# Patient Record
Sex: Male | Born: 1960
Health system: Southern US, Community
[De-identification: ages and names within clinical notes are randomized; demographics above are authoritative.]

## PROBLEM LIST (undated history)

## (undated) DIAGNOSIS — K5730 Diverticulosis of large intestine without perforation or abscess without bleeding: Secondary | ICD-10-CM

## (undated) DIAGNOSIS — I050 Rheumatic mitral stenosis: Secondary | ICD-10-CM

## (undated) DIAGNOSIS — E559 Vitamin D deficiency, unspecified: Secondary | ICD-10-CM

## (undated) DIAGNOSIS — R079 Chest pain, unspecified: Secondary | ICD-10-CM

## (undated) DIAGNOSIS — I341 Nonrheumatic mitral (valve) prolapse: Secondary | ICD-10-CM

## (undated) DIAGNOSIS — I38 Endocarditis, valve unspecified: Secondary | ICD-10-CM

## (undated) DIAGNOSIS — I2510 Atherosclerotic heart disease of native coronary artery without angina pectoris: Secondary | ICD-10-CM

## (undated) DIAGNOSIS — G43109 Migraine with aura, not intractable, without status migrainosus: Secondary | ICD-10-CM

## (undated) DIAGNOSIS — N400 Enlarged prostate without lower urinary tract symptoms: Secondary | ICD-10-CM

## (undated) DIAGNOSIS — E538 Deficiency of other specified B group vitamins: Secondary | ICD-10-CM

## (undated) DIAGNOSIS — R7303 Prediabetes: Secondary | ICD-10-CM

## (undated) DIAGNOSIS — G479 Sleep disorder, unspecified: Secondary | ICD-10-CM

## (undated) DIAGNOSIS — T8859XA Other complications of anesthesia, initial encounter: Secondary | ICD-10-CM

## (undated) DIAGNOSIS — I509 Heart failure, unspecified: Secondary | ICD-10-CM

## (undated) DIAGNOSIS — J189 Pneumonia, unspecified organism: Secondary | ICD-10-CM

## (undated) DIAGNOSIS — A6920 Lyme disease, unspecified: Secondary | ICD-10-CM

## (undated) HISTORY — PX: US ECHOCARDIOGRAPHY: HXRAD669

## (undated) HISTORY — PX: OTHER SURGICAL HISTORY: SHX169

## (undated) HISTORY — DX: Nonrheumatic mitral (valve) prolapse: I34.1

## (undated) HISTORY — DX: Migraine with aura, not intractable, without status migrainosus: G43.109

## (undated) HISTORY — DX: Lyme disease, unspecified: A69.20

---

## 1998-08-01 HISTORY — PX: INGUINAL HERNIA REPAIR: SUR1180

## 2003-08-02 DIAGNOSIS — A6920 Lyme disease, unspecified: Secondary | ICD-10-CM

## 2003-08-02 HISTORY — DX: Lyme disease, unspecified: A69.20

## 2003-08-05 ENCOUNTER — Ambulatory Visit (HOSPITAL_COMMUNITY): Admission: RE | Admit: 2003-08-05 | Discharge: 2003-08-05 | Payer: Self-pay | Admitting: Neurology

## 2003-08-11 ENCOUNTER — Inpatient Hospital Stay (HOSPITAL_COMMUNITY): Admission: EM | Admit: 2003-08-11 | Discharge: 2003-08-14 | Payer: Self-pay | Admitting: Emergency Medicine

## 2003-08-19 ENCOUNTER — Other Ambulatory Visit: Payer: Self-pay

## 2003-08-21 ENCOUNTER — Encounter: Payer: Self-pay | Admitting: Internal Medicine

## 2003-10-20 ENCOUNTER — Encounter: Payer: Self-pay | Admitting: Internal Medicine

## 2004-06-11 ENCOUNTER — Ambulatory Visit: Payer: Self-pay | Admitting: Internal Medicine

## 2005-03-28 ENCOUNTER — Ambulatory Visit: Payer: Self-pay | Admitting: Internal Medicine

## 2005-04-06 ENCOUNTER — Ambulatory Visit: Payer: Self-pay | Admitting: Internal Medicine

## 2005-07-14 ENCOUNTER — Ambulatory Visit: Payer: Self-pay | Admitting: Family Medicine

## 2005-07-22 ENCOUNTER — Ambulatory Visit: Payer: Self-pay | Admitting: General Surgery

## 2005-09-12 ENCOUNTER — Ambulatory Visit: Payer: Self-pay | Admitting: Internal Medicine

## 2006-05-26 ENCOUNTER — Ambulatory Visit: Payer: Self-pay | Admitting: Internal Medicine

## 2006-06-01 ENCOUNTER — Encounter: Payer: Self-pay | Admitting: Cardiology

## 2006-06-01 ENCOUNTER — Encounter: Payer: Self-pay | Admitting: Internal Medicine

## 2006-06-01 ENCOUNTER — Ambulatory Visit: Payer: Self-pay

## 2006-06-08 ENCOUNTER — Ambulatory Visit: Payer: Self-pay | Admitting: Internal Medicine

## 2006-06-09 ENCOUNTER — Encounter: Payer: Self-pay | Admitting: Internal Medicine

## 2006-06-09 ENCOUNTER — Ambulatory Visit: Payer: Self-pay | Admitting: Cardiology

## 2006-06-15 ENCOUNTER — Other Ambulatory Visit: Payer: Self-pay

## 2006-06-15 ENCOUNTER — Observation Stay: Payer: Self-pay | Admitting: Internal Medicine

## 2006-06-29 ENCOUNTER — Ambulatory Visit: Payer: Self-pay | Admitting: Internal Medicine

## 2006-07-17 ENCOUNTER — Ambulatory Visit: Payer: Self-pay | Admitting: Family Medicine

## 2006-07-31 ENCOUNTER — Ambulatory Visit: Payer: Self-pay | Admitting: Family Medicine

## 2006-08-10 ENCOUNTER — Ambulatory Visit: Payer: Self-pay | Admitting: Internal Medicine

## 2006-08-17 ENCOUNTER — Emergency Department: Payer: Self-pay | Admitting: General Practice

## 2006-08-28 ENCOUNTER — Ambulatory Visit: Payer: Self-pay | Admitting: Family Medicine

## 2006-08-28 LAB — CONVERTED CEMR LAB
ALT: 15 units/L (ref 0–40)
AST: 21 units/L (ref 0–37)
Albumin: 4.2 g/dL (ref 3.5–5.2)
Alkaline Phosphatase: 51 units/L (ref 39–117)
BUN: 11 mg/dL (ref 6–23)
Basophils Absolute: 0 10*3/uL (ref 0.0–0.1)
Basophils Relative: 0.1 % (ref 0.0–1.0)
CO2: 30 meq/L (ref 19–32)
Calcium: 9.5 mg/dL (ref 8.4–10.5)
Chloride: 102 meq/L (ref 96–112)
Creatinine, Ser: 0.9 mg/dL (ref 0.4–1.5)
Eosinophils Absolute: 0 10*3/uL (ref 0.0–0.6)
Eosinophils Relative: 0.2 % (ref 0.0–5.0)
GFR calc Af Amer: 118 mL/min
GFR calc non Af Amer: 97 mL/min
Glucose, Bld: 99 mg/dL (ref 70–99)
HCT: 44.9 % (ref 39.0–52.0)
Hemoglobin: 15.4 g/dL (ref 13.0–17.0)
Lymphocytes Relative: 17.2 % (ref 12.0–46.0)
MCHC: 34.3 g/dL (ref 30.0–36.0)
MCV: 94.5 fL (ref 78.0–100.0)
Monocytes Absolute: 0.5 10*3/uL (ref 0.2–0.7)
Monocytes Relative: 6.1 % (ref 3.0–11.0)
Neutro Abs: 6.3 10*3/uL (ref 1.4–7.7)
Neutrophils Relative %: 76.4 % (ref 43.0–77.0)
Platelets: 218 10*3/uL (ref 150–400)
Potassium: 4.3 meq/L (ref 3.5–5.1)
RBC: 4.75 M/uL (ref 4.22–5.81)
RDW: 11.6 % (ref 11.5–14.6)
Sed Rate: 8 mm/hr (ref 0–20)
Sodium: 140 meq/L (ref 135–145)
TSH: 0.92 microintl units/mL (ref 0.35–5.50)
Total Bilirubin: 0.7 mg/dL (ref 0.3–1.2)
Total Protein: 7.4 g/dL (ref 6.0–8.3)
WBC: 8.2 10*3/uL (ref 4.5–10.5)

## 2006-09-01 ENCOUNTER — Ambulatory Visit: Payer: Self-pay | Admitting: Internal Medicine

## 2006-11-21 HISTORY — PX: MITRAL VALVE REPAIR: SHX2039

## 2006-11-21 HISTORY — PX: THORACOTOMY: SUR1349

## 2006-12-19 ENCOUNTER — Encounter: Payer: Self-pay | Admitting: Internal Medicine

## 2006-12-27 DIAGNOSIS — I059 Rheumatic mitral valve disease, unspecified: Secondary | ICD-10-CM | POA: Insufficient documentation

## 2006-12-27 DIAGNOSIS — I050 Rheumatic mitral stenosis: Secondary | ICD-10-CM | POA: Insufficient documentation

## 2006-12-28 ENCOUNTER — Ambulatory Visit: Payer: Self-pay | Admitting: Internal Medicine

## 2007-02-19 ENCOUNTER — Encounter: Payer: Self-pay | Admitting: Internal Medicine

## 2007-03-14 ENCOUNTER — Encounter: Payer: Self-pay | Admitting: Internal Medicine

## 2007-04-03 ENCOUNTER — Ambulatory Visit: Payer: Self-pay | Admitting: Internal Medicine

## 2007-04-03 DIAGNOSIS — M2550 Pain in unspecified joint: Secondary | ICD-10-CM | POA: Insufficient documentation

## 2007-04-04 LAB — CONVERTED CEMR LAB
ALT: 19 units/L (ref 0–53)
AST: 20 units/L (ref 0–37)
Albumin: 3.9 g/dL (ref 3.5–5.2)
Alkaline Phosphatase: 68 units/L (ref 39–117)
BUN: 15 mg/dL (ref 6–23)
Basophils Absolute: 0 10*3/uL (ref 0.0–0.1)
Basophils Relative: 0.2 % (ref 0.0–1.0)
Bilirubin, Direct: 0.1 mg/dL (ref 0.0–0.3)
CO2: 32 meq/L (ref 19–32)
Calcium: 9.2 mg/dL (ref 8.4–10.5)
Chloride: 107 meq/L (ref 96–112)
Creatinine, Ser: 1 mg/dL (ref 0.4–1.5)
Eosinophils Absolute: 0.1 10*3/uL (ref 0.0–0.6)
Eosinophils Relative: 1.6 % (ref 0.0–5.0)
GFR calc Af Amer: 104 mL/min
GFR calc non Af Amer: 86 mL/min
Glucose, Bld: 100 mg/dL — ABNORMAL HIGH (ref 70–99)
HCT: 45.9 % (ref 39.0–52.0)
Hemoglobin: 15.8 g/dL (ref 13.0–17.0)
Lymphocytes Relative: 38.3 % (ref 12.0–46.0)
MCHC: 34.3 g/dL (ref 30.0–36.0)
MCV: 92.6 fL (ref 78.0–100.0)
Monocytes Absolute: 0.5 10*3/uL (ref 0.2–0.7)
Monocytes Relative: 10.2 % (ref 3.0–11.0)
Neutro Abs: 2.4 10*3/uL (ref 1.4–7.7)
Neutrophils Relative %: 49.7 % (ref 43.0–77.0)
Phosphorus: 3.5 mg/dL (ref 2.3–4.6)
Platelets: 175 10*3/uL (ref 150–400)
Potassium: 4.3 meq/L (ref 3.5–5.1)
RBC: 4.96 M/uL (ref 4.22–5.81)
RDW: 12.9 % (ref 11.5–14.6)
Rheumatoid fact SerPl-aCnc: <20 intl units/mL — ABNORMAL LOW (ref 0.0–20.0)
Sed Rate: 5 mm/hr (ref 0–20)
Sodium: 143 meq/L (ref 135–145)
TSH: 0.77 microintl units/mL (ref 0.35–5.50)
Total Bilirubin: 0.8 mg/dL (ref 0.3–1.2)
Total Protein: 7 g/dL (ref 6.0–8.3)
WBC: 4.8 10*3/uL (ref 4.5–10.5)

## 2007-04-05 LAB — CONVERTED CEMR LAB: Anti Nuclear Antibody(ANA): NEGATIVE

## 2007-04-19 ENCOUNTER — Ambulatory Visit: Payer: Self-pay | Admitting: Internal Medicine

## 2007-04-19 DIAGNOSIS — R42 Dizziness and giddiness: Secondary | ICD-10-CM

## 2007-05-01 ENCOUNTER — Encounter: Payer: Self-pay | Admitting: Internal Medicine

## 2007-06-11 ENCOUNTER — Encounter: Payer: Self-pay | Admitting: Internal Medicine

## 2007-06-13 ENCOUNTER — Ambulatory Visit: Payer: Self-pay | Admitting: Family Medicine

## 2007-06-13 DIAGNOSIS — J069 Acute upper respiratory infection, unspecified: Secondary | ICD-10-CM | POA: Insufficient documentation

## 2007-11-26 ENCOUNTER — Encounter: Payer: Self-pay | Admitting: Internal Medicine

## 2008-02-27 ENCOUNTER — Encounter: Payer: Self-pay | Admitting: Internal Medicine

## 2008-04-17 ENCOUNTER — Encounter: Payer: Self-pay | Admitting: Internal Medicine

## 2008-04-21 ENCOUNTER — Encounter: Payer: Self-pay | Admitting: Internal Medicine

## 2008-06-16 ENCOUNTER — Encounter: Payer: Self-pay | Admitting: Internal Medicine

## 2008-06-24 ENCOUNTER — Ambulatory Visit: Payer: Self-pay | Admitting: Internal Medicine

## 2008-06-24 DIAGNOSIS — I080 Rheumatic disorders of both mitral and aortic valves: Secondary | ICD-10-CM

## 2008-07-01 DIAGNOSIS — Q048 Other specified congenital malformations of brain: Secondary | ICD-10-CM

## 2008-07-01 HISTORY — DX: Other specified congenital malformations of brain: Q04.8

## 2008-07-01 HISTORY — PX: CYSTECTOMY: SUR359

## 2008-07-08 ENCOUNTER — Encounter: Payer: Self-pay | Admitting: Internal Medicine

## 2008-07-08 HISTORY — PX: CRANIOTOMY FOR CYST FENESTRATION: SHX1409

## 2008-07-21 ENCOUNTER — Ambulatory Visit: Payer: Self-pay | Admitting: Internal Medicine

## 2008-07-21 DIAGNOSIS — G930 Cerebral cysts: Secondary | ICD-10-CM

## 2009-08-26 ENCOUNTER — Telehealth: Payer: Self-pay | Admitting: Internal Medicine

## 2009-10-08 ENCOUNTER — Ambulatory Visit: Payer: Self-pay | Admitting: Internal Medicine

## 2009-10-08 DIAGNOSIS — M79609 Pain in unspecified limb: Secondary | ICD-10-CM

## 2009-10-08 DIAGNOSIS — L659 Nonscarring hair loss, unspecified: Secondary | ICD-10-CM | POA: Insufficient documentation

## 2010-04-27 ENCOUNTER — Telehealth: Payer: Self-pay | Admitting: Internal Medicine

## 2010-05-04 ENCOUNTER — Encounter: Payer: Self-pay | Admitting: Internal Medicine

## 2010-05-18 ENCOUNTER — Encounter: Payer: Self-pay | Admitting: Internal Medicine

## 2010-08-21 ENCOUNTER — Encounter: Payer: Self-pay | Admitting: Neurology

## 2010-08-29 LAB — CONVERTED CEMR LAB
ALT: 24 units/L (ref 0–53)
AST: 25 units/L (ref 0–37)
Albumin: 4.3 g/dL (ref 3.5–5.2)
Alkaline Phosphatase: 49 units/L (ref 39–117)
BUN: 18 mg/dL (ref 6–23)
Basophils Absolute: 0 10*3/uL (ref 0.0–0.1)
Basophils Relative: 0 % (ref 0.0–3.0)
Bilirubin Urine: NEGATIVE
Bilirubin, Direct: 0.1 mg/dL (ref 0.0–0.3)
CO2: 29 meq/L (ref 19–32)
Calcium: 9.4 mg/dL (ref 8.4–10.5)
Chloride: 105 meq/L (ref 96–112)
Creatinine, Ser: 1.1 mg/dL (ref 0.4–1.5)
Eosinophils Absolute: 0.1 10*3/uL (ref 0.0–0.7)
Eosinophils Relative: 1.3 % (ref 0.0–5.0)
GFR calc Af Amer: 93 mL/min
GFR calc non Af Amer: 77 mL/min
Glucose, Bld: 95 mg/dL (ref 70–99)
HCT: 45.3 % (ref 39.0–52.0)
Hemoglobin, Urine: NEGATIVE
Hemoglobin: 16 g/dL (ref 13.0–17.0)
INR: 1 (ref 0.8–1.0)
Ketones, ur: NEGATIVE mg/dL
Leukocytes, UA: NEGATIVE
Lymphocytes Relative: 34.9 % (ref 12.0–46.0)
MCHC: 35.3 g/dL (ref 30.0–36.0)
MCV: 95.4 fL (ref 78.0–100.0)
Monocytes Absolute: 0.6 10*3/uL (ref 0.1–1.0)
Monocytes Relative: 10.1 % (ref 3.0–12.0)
Neutro Abs: 2.9 10*3/uL (ref 1.4–7.7)
Neutrophils Relative %: 53.7 % (ref 43.0–77.0)
Nitrite: NEGATIVE
Phosphorus: 3.7 mg/dL (ref 2.3–4.6)
Platelets: 170 10*3/uL (ref 150–400)
Potassium: 3.8 meq/L (ref 3.5–5.1)
Protein, ur: NEGATIVE mg/dL
Prothrombin Time: 12.3 s (ref 10.9–13.3)
RBC / HPF: NONE SEEN (ref <3)
RBC: 4.75 M/uL (ref 4.22–5.81)
RDW: 12 % (ref 11.5–14.6)
Sodium: 139 meq/L (ref 135–145)
Specific Gravity, Urine: 1.025 (ref 1.005–1.03)
Total Bilirubin: 1.1 mg/dL (ref 0.3–1.2)
Total Protein: 7.3 g/dL (ref 6.0–8.3)
Urine Glucose: NEGATIVE mg/dL
Urobilinogen, UA: 0.2 (ref 0.0–1.0)
WBC: 5.6 10*3/uL (ref 4.5–10.5)
aPTT: 30.9 s — ABNORMAL HIGH (ref 21.7–29.8)
pH: 6.5 (ref 5.0–8.0)

## 2010-08-31 NOTE — Progress Notes (Signed)
Summary: wants referral to rheumatologist  Phone Note Call from Patient Call back at Home Phone [phone]   Caller: Patient Call For: Andrew Salt MD Summary of Call: Pt is asking for referral to rheumatologist for joint pain.  He says you are aware of his problems.  He does not want to go to [place]. Initial call taken by: Lowella Petties CMA,  April 27, 2010 11:24 AM  Follow-up for Phone Call        consult order put in Follow-up by: Andrew Salt MD,  April 27, 2010 2:09 PM

## 2010-08-31 NOTE — Assessment & Plan Note (Signed)
Summary: REFILL MED/DLO   Vital Signs:  Patient profile:   50 year old male Height:      70 inches Weight:      184 pounds BMI:     26.50 Temp:     98.7 degrees F oral Pulse rate:   72 / minute Pulse rhythm:   regular BP sitting:   112 / 80  (left arm) Cuff size:   large  Vitals Entered By: Mervin Hack CMA Duncan Dull) (October 08, 2009 4:07 PM) CC: med refill   History of Present Illness: doing much better Imbalance and tingling has subsided substantially Not really having the weakness  Still plays tennis  Needs the propecia no problems with this  still satisfied with the results  having trouble with foot pain Can be in either foot or wrist--at radial head Big toe, arch of foot, radial head are most common spots notes it most when first putting foot down first  wrist pain may be day or night better if he works it out some No swelling  Allergies: 1)  ! Levaquin  Past History:  Past medical, surgical, family and social histories (including risk factors) reviewed for relevance to current acute and chronic problems.  Past Medical History: Reviewed history from 12/27/2006 and no changes required. MVP/MR  Past Surgical History: Reviewed history from 07/21/2008 and no changes required. Post LP syndrome (tachycardia,  couldnt sleep)  08/2003 Lyme desease 20005 RIH  ([place])  2000 Echo  MVP/mild MR Normal EF  12/1990 Echo  moderate MR  10/2003 Holter  benign  10/2003 Mitral valve repair R  thoracotomy  10/2006 Arachnoid cyst removal 12/09  Family History: Reviewed history from 04/03/2007 and no changes required. Dad died @67   MI CAD in Dad and pat GF No HTN, DM, No prostate or colon cancer No lupus or RA  Social History: Reviewed history from 12/27/2006 and no changes required. 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   Head:  normocephalic, atraumatic, and no alopecia.   Msk:  normal ROM, no joint tenderness, and no joint swelling.   Neurologic:  strength normal in all extremities and gait normal.   Psych:  normally interactive, good eye contact, not anxious appearing, and not depressed appearing.     Impression & Recommendations:  Problem # 1:  FOOT PAIN, BILATERAL (ICD-729.5) Assessment New feet pain seems to be mechanical discussed trying stability shoes--he does have a preserved arch  Wrist pain at radial heads is puzzling if it continues, would set up rheum appt (though nothing to suggest true synovitis at this point)  Problem # 2:  MALE PATTERN BALDNESS (ICD-704.00) Assessment: Comment Only still doing well with the propecia  Complete Medication List: 1)  Propecia 1 Mg Tabs (Finasteride) .... Take 1 tablet by mouth once a day 2)  Aspirin 81 Mg Tbec (Aspirin) .... Take one by mouth daily 3)  Propecia Pro-pak 1 Mg Tabs (Finasteride) .Marland Kitchen.. 1 tab daily  Patient Instructions: 1)  Please schedule a follow-up appointment in 1 year.  Prescriptions: PROPECIA PRO-PAK 1 MG TABS (FINASTERIDE) 1 tab daily  #90 x 3   Entered and Authorized by:   Cindee Salt MD   Signed by:   Cindee Salt MD on 10/08/2009   Method  used:   [facility]ronically to        [id number]       [address]       [place], [place]  [address]       Ph: [phone]       Fax: [phone]   RxID:   [id number]   Current Allergies (reviewed today): ! LEVAQUIN

## 2010-08-31 NOTE — Progress Notes (Signed)
Summary: Needs appt.  Phone Note Outgoing Call Call back at [facility] Phone [phone]   Call placed by: DeShannon Katrinka Blazing CMA Duncan Dull),  August 26, 2009 3:10 PM Call placed to: Patient Summary of Call: calling pt to advise he needs a follow-up appt in order to get his refills, pt has not been seen since 07/2008. Initial call taken by: Mervin Hack CMA Duncan Dull),  August 26, 2009 3:10 PM  Follow-up for Phone Call        left message on machine that I would refill for 1 month and that pt needs appt. Follow-up by: Mervin Hack CMA Duncan Dull),  August 26, 2009 3:11 PM

## 2010-08-31 NOTE — Consult Note (Signed)
Summary: [facility]   Imported By: Lanelle Bal 05/18/2010 11:50:19  _____________________________________________________________________  External Attachment:    Type:   Image     Comment:   External Document  Appended Document: [facility] doing some testing but doesn't think there is an autoimmune disorder

## 2010-08-31 NOTE — Letter (Signed)
Summary: [facility]   Imported By: Sherian Rein 06/01/2010 08:55:35  _____________________________________________________________________  External Attachment:    Type:   Image     Comment:   External Document  Appended Document: [facility] still no answers for his symptoms

## 2010-11-22 ENCOUNTER — Other Ambulatory Visit: Payer: Self-pay | Admitting: Internal Medicine

## 2010-12-17 NOTE — H&P (Signed)
NAME:  Andrew Porter, Andrew Porter                   ACCOUNT NO.:  [id number]   MEDICAL RECORD NO.:  [id number]                   PATIENT TYPE:  INP   LOCATION:  1826                                 FACILITY:  [facility]   PHYSICIAN:  Genene Churn. Love, M.D.                 DATE OF BIRTH:  Nov 11, 1960   DATE OF ADMISSION:  08/11/2003  DATE OF DISCHARGE:                                HISTORY & PHYSICAL   PATIENT'S ADDRESS:  [address]  [place], [place] [address]   HISTORY OF PRESENT ILLNESS:  This is the first Andrew Porter admission  for this 50 year old right-handed white married male from [place], [place], admitted from the emergency room for evaluation of continued  headache and diffuse tingling.   HISTORY OF PRESENT ILLNESS:  In December of 2003, Andrew Porter had some  episodes of severe sudden headache associated with a visual disturbance  lasting for 5 to 10 minutes, but has also had progressive headache.  His  headaches did resolve but recurred sometime this spring and was evaluated  with MRI studies of the brain, Dec 27, 2002 and May 30, 2003 and  June 17, 2003, at [facility]; this included a pituitary  evaluation.  On July 13, 2004, he had an MRI angiogram which was felt to  be normal.  The MRIs showed evidence of an enlarged cisterna magna.  He has  had progression of his headaches and had also had episodes of tachycardia in  the past but a known history of mitral valve prolapse.  He had a plasma  metanephrine that was normal as an outpatient and an estimated sed rate that  was normal as an outpatient.  On August 05, 2003, he had a spinal tap with  opening pressure of 190 mmH20.  He CSF was normal except for an elevated  protein of 83.  He had only 2 white blood cells, no red blood cells, a  glucose of 63 with a protein of 83.  CSF IgG was unremarkable.  His CSF VDRL  was nonreactive and CSF cryptococcal antigen was negative.   He has continued  to have problems with headache; he has also had diffuse tingling.  He has  had episodes of tachycardia.  While at the short-stay unit for his LP, he  had episodes of tachycardia.  He had received prophylactic Augmentin for his  mitral valve prolapse and it was thought that the tachycardia may be  secondary to his mitral valve prolapse and he was given a prescription for  Toprol-XL 25 mg at home.  At home, he has continued to feel poorly and was  seen at [facility] ER, August 07, 2003, and at [facility] ER, August 09, 2003.  He had blood work at [facility].  He had  an EKG and what sounds to have been a chest x-ray and a CT scan of the  brain,  which I reviewed, which were reported to the patient as being normal.  He has had a T4 and TSH which were normal and a 24-hour urine for  metanephrine has been collected but not sent.  This morning, he continued to  feel badly.  He has not slept in several days, according to his mother and  according to his wife.  He comes to the emergency room for further  evaluation.   PAST MEDICAL HISTORY:  His past medical history is significant for:  1. Mitral valve prolapse.  2. A right hernia repair.  3. Headaches.   SOCIAL HISTORY:  He is married and states he is happy with his life, lives  with his wife, has a daughter 6 and sons 104 and 41 months of age in good  health.  He quit caffeine in 2002 and does not use tobacco.  He drinks 2  drinks per week of alcohol and he has a master's degree.  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   FAMILY HISTORY:  His mother is 69, living and well.  His father died at 37  from myocardial infarction.  He has sisters, 57, 10 and 62, living and well.   PHYSICAL EXAMINATION:  GENERAL:  Examination revealed a well-developed white  male who was somewhat anxious.  VITAL SIGNS:  The blood pressures in the right and left arm were 120/70.  Heart  rate was 88.  He was afebrile.  NECK:  There were no bruits.  His neck was supple.  NEUROLOGIC:  Mental status:  He was alert and oriented x3, followed one, two-  and three-step commands.  His cranial nerve examination revealed visual  fields full, disks flat, extraocular movements full, corneals present, no  7th nerve palsy, hearing intact, air conduction greater than bone  conduction, tongue midline, uvula midline, gags present.  Sternocleidomastoid and trapezius testing normal.  Motor examination:  Strength 5/5 proximally and distally in the upper and lower extremities.  Sensory examination intact to pinprick, light touch, joint position and  vibration testing.  Deep tendon reflexes were 1 to 2+ and plantar responses  downgoing.  HEENT:  General examination with tympanic membranes clear, mouth in good  repair.  LUNGS:  Lungs clear.  CARDIAC:  He had a loud late systolic murmur.  There was no enlargement of  liver, spleen or kidneys.  ABDOMEN:  Bowel sounds were normal.  EXTREMITIES:  There was no cyanosis, clubbing or edema in the extremities.   IMPRESSION:  1. Recurrent headaches, code 784.0.  2. Abnormal cerebrospinal fluid with elevated protein, code 792.0.  3. Mitral valve prolapse, code 421.0.  4. Anxiety, code 300.0.   PLAN:  Plan at this time is to admit the patient for MRI and MR venogram in  view of slightly elevated opening pressure of 190 at the time of LP and the  elevated CSF protein to see if there is any possibility of sagittal sinus  thrombosis as a cause for his headache and symptomatology.                                                Genene Churn. Sandria Manly, M.D.    JML/MEDQ  D:  08/11/2003  T:  08/11/2003  Job:  [id number]   cc:   Lorie Phenix  [address]  [place]  [place] [address]  Fax: [phone]

## 2010-12-17 NOTE — Consult Note (Signed)
NAME:  Andrew Porter, Andrew Porter                   ACCOUNT NO.:  [id number]   MEDICAL RECORD NO.:  [id number]                   PATIENT TYPE:  INP   LOCATION:  3032                                 FACILITY:  [facility]   PHYSICIAN:  Salvadore Farber, M.D.             DATE OF BIRTH:  1960-08-17   DATE OF CONSULTATION:  08/13/2003  DATE OF DISCHARGE:                                   CONSULTATION   REFERRING PHYSICIAN:  Genene Churn. Love, M.D.   REASON FOR CONSULTATION:  I was asked by Dr. Genene Churn. Love to see this  patient with palpitations and headaches.   HISTORY OF PRESENT ILLNESS:  Andrew Porter is a 50 year old gentleman with  mitral valve prolapse and chronic headaches.  He has been evaluated over the  past 14 months for severe and chronic headaches with an extremely extensive  evaluation at multiple hospitals.   One week ago, while undergoing lumbar puncture by Dr. Genene Churn. Love the  patient experienced palpitations.  He was discharged to home.  The details  of that are currently available.  He subsequently awoke with palpitations  and went to [facility] Room.  There he was told he had no arrhythmia and  was discharged home.  He is now hospitalized for further evaluation of his  headaches.  He tells me that he is currently experiencing palpitations  similar to those which occurred during his lumbar puncture, similar to those  which prompted his presentation to [facility].   Monitor currently demonstrates normal sinus rhythm and has been sinus rhythm  throughout his hospitalization here.  Associated with these palpitations, he  tells me he has tingling all over his body particularly of his scalp.  Denies nausea, vomiting, diaphoresis, chest pain, exertional dyspnea, PND,  orthopnea, and edema.  No history of syncope.   PAST MEDICAL HISTORY:  1. Chronic headache.  2. Mitral valve prolapse.  3. Right inguinal hernia repair.   ALLERGIES:  No known drug allergies.   MEDICATIONS:   Xanax, Lexapro, Toprol XL 25 mg q.d. started this  hospitalization.   SOCIAL HISTORY:  The patient lives in [place] with his wife.  He is a  Economist of a Psychiatric nurse.  He is married with three young  children.  Denies tobacco use.  Drinks occasionally.   FAMILY HISTORY:  Father died at 4 of myocardial infarction.  His mother and  three sisters are alive and well.   REVIEW OF SYMPTOMS:  There is a small rash on the left side of his face,  altered vision with headaches.  There are no distinct aura.  Complains of  nasal discharge, occasional nose bleeds, vertigo, presyncope, though not  accompanying his palpitations.  Occasional chest pain, also not accompanying  his palpitations.   PHYSICAL EXAMINATION:  GENERAL:  He is a generally well-appearing male in no  distress.  VITAL SIGNS:  Heart is 65, blood pressure 138/72, respiratory rate 18.  Oxygen saturation of 95% on room air.  NECK:  He has no jugular venous distention.  LUNGS: Clear to auscultation.  HEART:  He has a nondisplaced point of maximum cardiac impulse.  There is a  regular rate and rhythm with a 2/6 holosystolic murmur best heard at the  apex and radiating to the axilla.  ABDOMEN:  Soft, nontender, and nondistended.  There is no  hepatosplenomegaly.  Bowel sounds are normal.  EXTREMITIES:  Warm without edema.  SKIN:  Questionable malar rash on his rash.  NEUROLOGICAL:  Grossly intact.   LABORATORY DATA:  Electrocardiogram shows normal sinus rhythm, normal EKG.  Monitor rhythm shows sinus rhythm with rates ranging from 44 beats per  minute to 90 beats per minute.  No pauses.  No SVT and no VT.   LABORATORY DATA:  Remarkable for a hematocrit of 38, creatinine 1.0, TSH  2.3.   IMPRESSION:  1. Palpitations:  The patient complains of palpitations during documented     normal sinus rhythm.  There is firm evidence that he is not having an     arrhythmia.  While he is in hospital for other reasons,  would continue     monitor to further definitively exclude arrhythmia.  I think arrhythmia     is unlikely.  2. Mitral regurgitation:  At least moderate mitral regurgitation by exam.     We will obtain echocardiogram to assess.  The patient will need     endocarditis prophylaxis.  3. Headaches:  Per Dr. Genene Churn. Love.                                               Salvadore Farber, M.D.    WED/MEDQ  D:  08/13/2003  T:  08/14/2003  Job:  [id number]   cc:   Genene Churn. Love, M.D.  [address]  [address]  [place]  [place] [address]  Fax: [phone]   Lorie Phenix  [address]  [place]  [place] [address]  Fax: [phone]

## 2010-12-17 NOTE — Op Note (Signed)
NAME:  Andrew Porter, Andrew Porter                   ACCOUNT NO.:  [id number]   MEDICAL RECORD NO.:  [id number]                   PATIENT TYPE:  OUT   LOCATION:  MDC                                  FACILITY:  [facility]   PHYSICIAN:  Genene Churn. Love, M.D.                 DATE OF BIRTH:  [birth year]   DATE OF PROCEDURE:  08/05/2003  DATE OF DISCHARGE:                                 OPERATIVE REPORT   ADDENDUM:  During the day, the patient had palpitations of heart rate to  140s.  An IV was recommended but he did not wish this.  Xanax was  recommended but he did not wish this.  A beta blocker was recommended, but  again, he did not wish that.  He began to feel better with time alone.  His  laboratory results revealed white blood cell count 4,800, hemoglobin 14.8,  hematocrit 43.6, platelet count 184,000.  Sodium 139, potassium 3.9,  chloride 105, CO2 29, BUN 12, creatinine 1, glucose 131.  Liver function  tests normal.  CSF showed 2 white blood cells, no red blood cells, glucose  was 63, protein 83, cryptococcal antigens negative.  The patient has a known  history of mitral valve prolapse in the past.   IMPRESSION:  Tachycardia, possibly related to a history of mitral valve  prolapse.   PLAN:  1 gram Amoxicillin as SBE preventative and 500 mg b.i.d. tomorrow.  Also, to start Toprol XL 1/2 50 mg tablet per day should palpitations  continue and also to be used for migraine prophylaxis.                                               Genene Churn. Sandria Manly, M.D.    JML/MEDQ  D:  08/05/2003  T:  08/05/2003  Job:  [id number]

## 2010-12-17 NOTE — Op Note (Signed)
NAME:  Andrew Porter, Andrew Porter                   ACCOUNT NO.:  [id number]   MEDICAL RECORD NO.:  [id number]                   PATIENT TYPE:  OUT   LOCATION:  MDC                                  FACILITY:  [facility]   PHYSICIAN:  Genene Churn. Love, M.D.                 DATE OF BIRTH:  11-26-1960   DATE OF PROCEDURE:  08/05/2003  DATE OF DISCHARGE:                                 OPERATIVE REPORT   PROCEDURE:   SURGEON:  Genene Churn. Love, M.D.   INDICATIONS:  The patient has a history of headaches and is being evaluated  for opening pressure and CSF evaluation.   DESCRIPTION OF PROCEDURE:  The patient was prepped and draped in the left  lateral decubitus position using Betadine and 1% Xylocaine.  The L3-4  interspace was entered without difficulty.  On entry, the patient complained  of some left leg pain which resolved within seconds.  He did develop  hyperventilation and tachycardia during that time.  Opening pressure then  was measured.  It was 190. This was thought to be related to the anxiety.  Clear colored CSF was obtained and sent for studies.  The patient tolerated  the procedure well.  He did complain during the study of some left hand  numbness as well.  He was lying on his left arm.                                               Genene Churn. Sandria Manly, M.D.    JML/MEDQ  D:  08/05/2003  T:  08/05/2003  Job:  [id number]

## 2010-12-17 NOTE — Discharge Summary (Signed)
NAME:  Andrew Porter, Andrew Porter                   ACCOUNT NO.:  [id number]   MEDICAL RECORD NO.:  [id number]                   PATIENT TYPE:  INP   LOCATION:  3032                                 FACILITY:  [facility]   PHYSICIAN:  Genene Churn. Love, M.D.                 DATE OF BIRTH:  October 12, 1960   DATE OF ADMISSION:  08/11/2003  DATE OF DISCHARGE:  08/14/2003                                 DISCHARGE SUMMARY   REASON FOR ADMISSION:  This was the first [facility] admission for  this 50 year old right-handed white married male from [place], [place] admitted from the emergency room for evaluation of continued  headache and diffuse tingling.   HISTORY OF PRESENT ILLNESS:  In December 2003 Mr. Mallinger had episodes of  sudden headache at times associated with visual disturbance lasting 5-10  minutes and also progressive headache which was evaluated with MRI studies  of the brain Dec 27, 2002, May 30, 2003, and June 17, 2003.  These  were not all MRI studies of the brain, some were of the pituitary gland.  On  July 14, 2003 after seeing me he had an MR angiogram at [facility] which was felt to be normal.  His MRIs have shown evidence  of an enlarged cisterna magna.  He has a known history of mitral valve  prolapse.  As an outpatient estimated sed rate and plasma metanephrine have  been normal for evaluation of headaches.  On August 05, 2003 he had a spinal  tap with adding pressure of 190 mmH2O CSF was normal except for elevated  protein of 83, there were 2 white blood cells, no red blood cells, glucose  of 63, normal CSF IgG, normal CSF oligoclonal IgG and negative CSF  cryptococcal antigen and negative bacterial antigens and bacterial culture.  Fungal culture is still pending as is a Lyme test.  While in short-stay unit  following his LP he developed episodes of tachycardia, he had received  prophylactic Augmentin for mitral valve prolapse though it  was a sterile  procedure.  At home he continued to have episodes of tachycardia and noted  that when he would lie down he would develop a burning sensation on the top  of his head which is similar to what he had had prior to the LP.  These  episodes seem to be worse with lying down.  He was seen at the emergency  room at [facility] and also at [facility].  As an outpatient he had a T4, TSH, and 24-hour urine for  metanephrines and 5-HIAA which came back during this hospitalization which  were normal.  He was seen in the emergency room on the morning of August 11, 2003 for continued symptoms and admitted for evaluation.   PAST MEDICAL HISTORY:  His past medical history is significant for mitral  valve prolapse, right hernia repair, and headaches.  SOCIAL HISTORY:  He is married, states he is happy with life, lives with his  daughter 28 and son 7 years and one 82 months of age.  He quit caffeine  February 2002.  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   PHYSICAL EXAMINATION:  His general examination was unremarkable.   LABORATORY DATA:  Twelve-lead EKG was unremarkable.  MRI study of the brain  and MRV to rule out sagittal sinus thrombosis was unremarkable.  Telemetry  in the hospital revealed heart rates as high as 90 and as low as in the 50s.  His blood studies included a CBC and a basic metabolic panel which showed no  abnormalities.  Repeat 24-hour urine for metanephrines was being performed  since the first could not be found initially.  Liver function tests were not  performed during this hospitalization.  Twelve lead EKG was normal.   HOSPITAL COURSE:  Patient was seen in consult by Dr. Samule Ohm.  It is felt  that his cardiac evaluation could be continued as an outpatient, he did not  receive a 2-D echo inhospital, he was found to have evidence of mitral  insufficiency with a loud systolic murmur.  In the  hospital he was treated  with Xanax and Lexapro for what was thought to be anxiety symptoms as well  as his head discomfort.  It was not felt he had a post-LP headache since he  felt better standing up.  He gradually improved in the hospital.  Prior to  his hospitalization he had difficulty sleeping but in the hospital using IV  Ativan he did sleep well.  He had improved in the hospital and was deemed  ready to be discharged.   IMPRESSION:  1. Recurrent headaches.  (Code 784.0)  2. Elevated CSF protein.  (Code 792.0)  3. Mitral valve insufficiency.  (Code 424.0)  4. Anxiety.  (Code 300.00)   PLAN:  Treat the patient with Xanax 0.25 mg t.i.d., Lexapro 5 mg once daily,  Toprol XL 25 mg once daily, stay out of work for 1 week and return to see me  in 2 weeks and have a 2-D echocardiogram as an outpatient, he is discharged  improved from his prehospital status.                                                Genene Churn. Sandria Manly, M.D.    JML/MEDQ  D:  08/14/2003  T:  08/14/2003  Job:  [id number]   cc:   Dr. Elease Hashimoto, primary care physician  [facility]   Salvadore Farber, M.D.  [address]  [address]  [place]  [place] [address]

## 2011-02-28 ENCOUNTER — Telehealth: Payer: Self-pay | Admitting: *Deleted

## 2011-02-28 MED ORDER — SCOPOLAMINE 1 MG/3DAYS TD PT72: 1.0000 | MEDICATED_PATCH | TRANSDERMAL | Status: DC

## 2011-02-28 NOTE — Telephone Encounter (Signed)
Patient will be leaving on Thursday for a cruise and is asking for motion sickness patches. Uses [facility] [address]

## 2011-02-28 NOTE — Telephone Encounter (Signed)
Sent!

## 2011-08-10 ENCOUNTER — Other Ambulatory Visit: Payer: Self-pay | Admitting: *Deleted

## 2011-08-10 MED ORDER — FINASTERIDE 1 MG PO TABS: 1.0000 mg | ORAL_TABLET | Freq: Every day | ORAL | Status: DC

## 2011-08-10 NOTE — Telephone Encounter (Signed)
rx sent to pharmacy by e-script Left message asking patient to schedule appointment.

## 2011-08-10 NOTE — Telephone Encounter (Signed)
Okay to refill for a year but have him set up PE within the next few months

## 2011-08-10 NOTE — Telephone Encounter (Signed)
Does pt need to be seen? Last seen 09/2009 was told to f/u in 1 year, last CPX 2008.

## 2011-08-11 ENCOUNTER — Other Ambulatory Visit: Payer: Self-pay | Admitting: Internal Medicine

## 2012-01-10 ENCOUNTER — Encounter: Payer: Self-pay | Admitting: Internal Medicine

## 2012-01-10 ENCOUNTER — Ambulatory Visit (INDEPENDENT_AMBULATORY_CARE_PROVIDER_SITE_OTHER): Payer: BC Managed Care – PPO | Admitting: Internal Medicine

## 2012-01-10 VITALS — BP 118/80 | HR 77 | Temp 97.7°F | Wt 179.0 lb

## 2012-01-10 DIAGNOSIS — N453 Epididymo-orchitis: Secondary | ICD-10-CM | POA: Insufficient documentation

## 2012-01-10 DIAGNOSIS — R059 Cough, unspecified: Secondary | ICD-10-CM | POA: Insufficient documentation

## 2012-01-10 DIAGNOSIS — R05 Cough: Secondary | ICD-10-CM

## 2012-01-10 MED ORDER — DOXYCYCLINE HYCLATE 100 MG PO TABS: 100.0000 mg | ORAL_TABLET | Freq: Two times a day (BID) | ORAL | Status: AC

## 2012-01-10 NOTE — Assessment & Plan Note (Signed)
Seems to be post infectious cough Discussed cough meds---he will defer this

## 2012-01-10 NOTE — Progress Notes (Signed)
  Subjective:    Patient ID: Andrew Porter, male    DOB: [birth year], 51 y.o.   MRN: [id number]  HPI About 4 weeks ago got fever, chills and sore throat Got zpak at walk in clinic in [place] Helped sore throat but still with persistent cough occ gets some yellow mucus Cough worse at night No fever or chills now No SOB  Noted right testicle swelling and severe pain since then Right groin is painful now Started some left over augmentin last week---helped the swelling and pain (though some better) Has been on 2gm bid for full Rx (left over from Lyme disease times)  Current Outpatient Prescriptions on File Prior to Visit  Medication Sig Dispense Refill  . finasteride (PROPECIA) 1 MG tablet Take 1 tablet (1 mg total) by mouth daily.  90 tablet  2    Allergies  Allergen Reactions  . Levofloxacin     REACTION: psychotic nightmares, muscle aches    Past Medical History  Diagnosis Date  . MVP (mitral valve prolapse)   . Lyme disease 2005    Past Surgical History  Procedure Date  . Inguinal hernia repair 2000    right ( byrnett)  . US echocardiography 12/1990 & 10/2003    MVP/ mild MR normal EF, moderate MR 10/2003  . Cystectomy 12/09    arachnoid    Family History  Problem Relation Age of Onset  . Coronary artery disease Father   . Coronary artery disease Paternal Grandfather   . Diabetes Neg Hx   . Hypertension Neg Hx   . Cancer Neg Hx     History   Social History  . Marital Status: Married    Spouse Name: N/A    Number of Children: 3  . Years of Education: N/A   Occupational History  . president of [employer]    Social History Main Topics  . Smoking status: Never Smoker   . Smokeless tobacco: Never Used  . Alcohol Use: Yes  . Drug Use: No  . Sexually Active: Not on file   Other Topics Concern  . Not on file   Social History Narrative  . No narrative on file   Review of Systems Voids okay No dysuria No sex outside of  marriage Finally coming out of the prolonged unusual neurologic/rheumatologic symptoms    Objective:   Physical Exam  Constitutional: He appears well-developed and well-nourished. No distress.  HENT:  Mouth/Throat: Oropharynx is clear and moist. No oropharyngeal exudate.  Neck: Normal range of motion. Neck supple. No thyromegaly present.  Pulmonary/Chest: Effort normal and breath sounds normal. No respiratory distress. He has no wheezes. He has no rales.  Abdominal: Soft. He exhibits no mass. There is no tenderness.  Genitourinary:       No inguinal hernia Mild swelling in right testes and epididymis with tenderness  Lymphadenopathy:    He has no cervical adenopathy.          Assessment & Plan:

## 2012-01-10 NOTE — Assessment & Plan Note (Signed)
Mild ongoing inflammation  Seems to have partially responded to the augmentin Will finish the augmentin If ongoing symptoms--rx for doxy to use prn

## 2012-03-02 ENCOUNTER — Ambulatory Visit: Payer: BC Managed Care – PPO | Admitting: Internal Medicine

## 2012-05-22 ENCOUNTER — Encounter: Payer: Self-pay | Admitting: Internal Medicine

## 2012-05-22 ENCOUNTER — Ambulatory Visit (INDEPENDENT_AMBULATORY_CARE_PROVIDER_SITE_OTHER): Payer: BC Managed Care – PPO | Admitting: Internal Medicine

## 2012-05-22 VITALS — BP 112/78 | HR 69 | Temp 97.9°F | Ht 70.0 in | Wt 183.0 lb

## 2012-05-22 DIAGNOSIS — G479 Sleep disorder, unspecified: Secondary | ICD-10-CM

## 2012-05-22 DIAGNOSIS — Z0000 Encounter for general adult medical examination without abnormal findings: Secondary | ICD-10-CM | POA: Insufficient documentation

## 2012-05-22 DIAGNOSIS — G43109 Migraine with aura, not intractable, without status migrainosus: Secondary | ICD-10-CM

## 2012-05-22 DIAGNOSIS — Z23 Encounter for immunization: Secondary | ICD-10-CM

## 2012-05-22 DIAGNOSIS — Z1211 Encounter for screening for malignant neoplasm of colon: Secondary | ICD-10-CM

## 2012-05-22 LAB — CBC WITH DIFFERENTIAL/PLATELET
Basophils Absolute: 0 10*3/uL (ref 0.0–0.1)
Basophils Relative: 0.6 % (ref 0.0–3.0)
Eosinophils Absolute: 0 10*3/uL (ref 0.0–0.7)
Eosinophils Relative: 0.8 % (ref 0.0–5.0)
HCT: 44.8 % (ref 39.0–52.0)
Hemoglobin: 14.9 g/dL (ref 13.0–17.0)
Lymphocytes Relative: 38.7 % (ref 12.0–46.0)
Lymphs Abs: 1.7 10*3/uL (ref 0.7–4.0)
MCHC: 33.3 g/dL (ref 30.0–36.0)
MCV: 97.1 fl (ref 78.0–100.0)
Monocytes Absolute: 0.5 10*3/uL (ref 0.1–1.0)
Monocytes Relative: 10.1 % (ref 3.0–12.0)
Neutro Abs: 2.2 10*3/uL (ref 1.4–7.7)
Neutrophils Relative %: 49.8 % (ref 43.0–77.0)
Platelets: 161 10*3/uL (ref 150.0–400.0)
RBC: 4.61 Mil/uL (ref 4.22–5.81)
RDW: 12.7 % (ref 11.5–14.6)
WBC: 4.5 10*3/uL (ref 4.5–10.5)

## 2012-05-22 LAB — HEPATIC FUNCTION PANEL
ALT: 18 U/L (ref 0–53)
AST: 24 U/L (ref 0–37)
Albumin: 4 g/dL (ref 3.5–5.2)
Alkaline Phosphatase: 59 U/L (ref 39–117)
Bilirubin, Direct: 0.1 mg/dL (ref 0.0–0.3)
Total Bilirubin: 0.8 mg/dL (ref 0.3–1.2)

## 2012-05-22 LAB — BASIC METABOLIC PANEL
BUN: 22 mg/dL (ref 6–23)
CO2: 30 mEq/L (ref 19–32)
Calcium: 9.2 mg/dL (ref 8.4–10.5)
Chloride: 104 mEq/L (ref 96–112)
Creatinine, Ser: 1 mg/dL (ref 0.4–1.5)
GFR: 81.13 mL/min (ref >60.00)
Glucose, Bld: 79 mg/dL (ref 70–99)
Potassium: 4.3 mEq/L (ref 3.5–5.1)
Sodium: 138 mEq/L (ref 135–145)

## 2012-05-22 LAB — LIPID PANEL
Cholesterol: 200 mg/dL (ref 0–200)
LDL Cholesterol: 131 mg/dL — ABNORMAL HIGH (ref 0–99)
Total CHOL/HDL Ratio: 4
Triglycerides: 96 mg/dL (ref 0.0–149.0)
VLDL: 19.2 mg/dL (ref 0.0–40.0)

## 2012-05-22 MED ORDER — TRAZODONE HCL 50 MG PO TABS: 50.0000 mg | ORAL_TABLET | Freq: Every day | ORAL | Status: DC

## 2012-05-22 MED ORDER — SUMATRIPTAN SUCCINATE 100 MG PO TABS: 50.0000 mg | ORAL_TABLET | Freq: Every day | ORAL | Status: DC | PRN

## 2012-05-22 NOTE — Assessment & Plan Note (Signed)
Will try trazodone

## 2012-05-22 NOTE — Assessment & Plan Note (Signed)
Has clear cut history now May have some relationship to long standing sensory disturbance Will try sumatriptan at aura

## 2012-05-22 NOTE — Assessment & Plan Note (Signed)
Undiagnosed long term neurologic condition Otherwise healthy Will do stool immunoassay PSA after discussion  Tdap

## 2012-05-22 NOTE — Progress Notes (Signed)
Subjective:    Patient ID: Andrew Porter, male    DOB: [birth year], 51 y.o.   MRN: [id number]  HPI Here for physical Still with neurologic symptoms Still has tingling episodes Intermittent vision loss--everything is blurry in both eyes Does feel better since the arachnoid cyst but still there  Still plays tennis, does about 2/3rds full work schedule Only able to get about 5 hours of sleep Sleeps a little more soundly with diphenhydramine  Discussed cancer screening  Current Outpatient Prescriptions on File Prior to Visit  Medication Sig Dispense Refill  . finasteride (PROPECIA) 1 MG tablet Take 1 tablet (1 mg total) by mouth daily.  90 tablet  2  . traZODone (DESYREL) 50 MG tablet Take 1-2 tablets (50-100 mg total) by mouth at bedtime.  60 tablet  11    Allergies  Allergen Reactions  . Levofloxacin     REACTION: psychotic nightmares, muscle aches    Past Medical History  Diagnosis Date  . MVP (mitral valve prolapse)   . Lyme disease 2005    Past Surgical History  Procedure Date  . Inguinal hernia repair 2000    right ( byrnett)  . US echocardiography 12/1990 & 10/2003    MVP/ mild MR normal EF, moderate MR 10/2003  . Cystectomy 12/09    arachnoid    Family History  Problem Relation Age of Onset  . Coronary artery disease Father   . Coronary artery disease Paternal Grandfather   . Diabetes Neg Hx   . Hypertension Neg Hx   . Cancer Neg Hx     History   Social History  . Marital Status: Married    Spouse Name: N/A    Number of Children: 3  . Years of Education: N/A   Occupational History  . president of [employer]    Social History Main Topics  . Smoking status: Never Smoker   . Smokeless tobacco: Never Used  . Alcohol Use: Yes  . Drug Use: No  . Sexually Active: Not on file   Other Topics Concern  . Not on file   Social History Narrative  . No narrative on file   Review of Systems  Constitutional: Negative for fatigue  and unexpected weight change.       Wears seat belt  HENT: Negative for hearing loss, congestion, rhinorrhea, dental problem and tinnitus.        Regular with dentist  Eyes: Positive for visual disturbance.       Gets blurry vision and episodic focal vision loss  Respiratory: Negative for cough, chest tightness and shortness of breath.   Cardiovascular: Positive for leg swelling. Negative for chest pain and palpitations.       Gets indentation in lower calves at times  Gastrointestinal: Negative for nausea, vomiting, abdominal pain, constipation and blood in stool.       No heartburn  Genitourinary: Positive for urgency. Negative for frequency.       Urgency at night  No sexual problems  Musculoskeletal: Negative for back pain, joint swelling and arthralgias.  Skin: Positive for rash.       Rash on chest and scalp---has seen derm  Neurological: Positive for dizziness, numbness and headaches. Negative for syncope, weakness and light-headedness.       Does get headaches after focal vision loss Uses ibuprofen or exedrin migraine Limited caffeine  Hematological: Negative for adenopathy. Does not bruise/bleed easily.  Psychiatric/Behavioral: Positive for disturbed wake/sleep cycle. Negative for dysphoric mood. The patient  is not nervous/anxious.        Objective:   Physical Exam  Constitutional: He is oriented to person, place, and time. He appears well-developed and well-nourished. No distress.  HENT:  Head: Normocephalic and atraumatic.  Right Ear: External ear normal.  Left Ear: External ear normal.  Mouth/Throat: Oropharynx is clear and moist. No oropharyngeal exudate.  Eyes: Conjunctivae normal and EOM are normal. Pupils are equal, round, and reactive to light.  Neck: Normal range of motion. Neck supple. No thyromegaly present.  Cardiovascular: Normal rate, regular rhythm, normal heart sounds and intact distal pulses.  Exam reveals no gallop.   No murmur heard. Pulmonary/Chest:  Effort normal and breath sounds normal. No respiratory distress. He has no wheezes. He has no rales.  Abdominal: Soft. There is no tenderness.  Musculoskeletal: He exhibits no edema and no tenderness.  Lymphadenopathy:    He has no cervical adenopathy.  Neurological: He is alert and oriented to person, place, and time.  Skin: No rash noted. No erythema.  Psychiatric: He has a normal mood and affect. His behavior is normal. Thought content normal.          Assessment & Plan:

## 2012-05-22 NOTE — Addendum Note (Signed)
Addended by: Sueanne Margarita on: 05/22/2012 10:34 AM   Modules accepted: Orders

## 2012-05-24 LAB — TSH: TSH: 1.43 u[IU]/mL (ref 0.35–5.50)

## 2012-05-24 LAB — VITAMIN B12: Vitamin B-12: 509 pg/mL (ref 211–911)

## 2012-05-24 LAB — PSA: PSA: 0.42 ng/mL (ref 0.10–4.00)

## 2012-05-28 ENCOUNTER — Encounter: Payer: Self-pay | Admitting: *Deleted

## 2012-08-16 ENCOUNTER — Telehealth: Payer: Self-pay

## 2012-08-16 NOTE — Telephone Encounter (Signed)
Ginger, pts wife left v/m requesting 05/23/12 CPX office notes and lab results be faxed to Ginger at [phone]. I do not see where pt has signed designated party release form.Please advise.

## 2012-08-16 NOTE — Telephone Encounter (Signed)
He has told me Okay to fax once you have double checked the number

## 2012-08-16 NOTE — Telephone Encounter (Signed)
Is this ok? That fax number is not local

## 2012-08-20 NOTE — Telephone Encounter (Signed)
Left message that I need to verify fax number to fax office notes to.

## 2012-08-26 ENCOUNTER — Other Ambulatory Visit: Payer: Self-pay | Admitting: Internal Medicine

## 2013-05-10 ENCOUNTER — Emergency Department: Payer: Self-pay | Admitting: Internal Medicine

## 2013-05-10 LAB — BASIC METABOLIC PANEL
BUN: 19 mg/dL — ABNORMAL HIGH (ref 7–18)
Calcium, Total: 8.5 mg/dL (ref 8.5–10.1)
Co2: 27 mmol/L (ref 21–32)
Creatinine: 1.34 mg/dL — ABNORMAL HIGH (ref 0.60–1.30)
EGFR (Non-African Amer.): >60
Potassium: 3.7 mmol/L (ref 3.5–5.1)
Sodium: 139 mmol/L (ref 136–145)

## 2013-05-10 LAB — CBC
HCT: 42.1 % (ref 40.0–52.0)
MCH: 32.8 pg (ref 26.0–34.0)
MCHC: 34.3 g/dL (ref 32.0–36.0)
WBC: 5.7 10*3/uL (ref 3.8–10.6)

## 2013-05-10 LAB — CK: CK, Total: 153 U/L (ref 35–232)

## 2013-05-10 LAB — LIPID PANEL
Cholesterol: 175 mg/dL (ref 0–200)
HDL Cholesterol: 46 mg/dL (ref 40–60)
Ldl Cholesterol, Calc: 95 mg/dL (ref 0–100)
VLDL Cholesterol, Calc: 34 mg/dL (ref 5–40)

## 2013-05-10 LAB — LIPASE, BLOOD: Lipase: 202 U/L (ref 73–393)

## 2013-05-10 LAB — TROPONIN I: Troponin-I: <0.02 ng/mL

## 2013-05-15 ENCOUNTER — Ambulatory Visit: Payer: BC Managed Care – PPO | Admitting: Internal Medicine

## 2013-05-23 ENCOUNTER — Encounter: Payer: BC Managed Care – PPO | Admitting: Internal Medicine

## 2013-05-25 ENCOUNTER — Other Ambulatory Visit: Payer: Self-pay | Admitting: Internal Medicine

## 2013-06-03 ENCOUNTER — Encounter: Payer: Self-pay | Admitting: Internal Medicine

## 2013-06-03 ENCOUNTER — Ambulatory Visit (INDEPENDENT_AMBULATORY_CARE_PROVIDER_SITE_OTHER): Payer: BC Managed Care – PPO | Admitting: Internal Medicine

## 2013-06-03 VITALS — BP 110/70 | HR 60 | Temp 97.9°F | Wt 182.0 lb

## 2013-06-03 DIAGNOSIS — Z23 Encounter for immunization: Secondary | ICD-10-CM

## 2013-06-03 DIAGNOSIS — Z0000 Encounter for general adult medical examination without abnormal findings: Secondary | ICD-10-CM

## 2013-06-03 DIAGNOSIS — Z1211 Encounter for screening for malignant neoplasm of colon: Secondary | ICD-10-CM

## 2013-06-03 MED ORDER — SELENIUM SULFIDE 2.5 % EX LOTN: TOPICAL_LOTION | CUTANEOUS | Status: DC

## 2013-06-03 NOTE — Addendum Note (Signed)
Addended by: Sueanne Margarita on: 06/03/2013 03:56 PM   Modules accepted: Orders

## 2013-06-03 NOTE — Patient Instructions (Signed)
You can try miralax (polyethylene glycol) if your bowels get bound up.

## 2013-06-03 NOTE — Progress Notes (Signed)
Subjective:    Patient ID: Andrew Porter, male    DOB: 1960-11-23, 52 y.o.   MRN: [id number]  HPI Here for physical  Long term strange neuro symptoms like tingling, fatigue are all improved Has tried some supplements Using 2 multivitamins, magnesium and vitamin D 5000 units daily  Plays tennis hard for up to 2 ours Still on part time work schedule ---- 5 hours in office a day Sleeping is much better now-- 5 hours straight and then a little more after voiding  No headaches lately No visual disturbances for over 2 months The unstable feeling also not recently  Current Outpatient Prescriptions on File Prior to Visit  Medication Sig Dispense Refill  . aspirin 81 MG tablet Take 81 mg by mouth daily.      . Cholecalciferol 4000 UNITS CAPS Take by mouth daily.      . finasteride (PROPECIA) 1 MG tablet TAKE 1 TABLET DAILY  90 tablet  2  . ibuprofen (ADVIL,MOTRIN) 200 MG tablet Take 200 mg by mouth as needed.       No current facility-administered medications on file prior to visit.    Allergies  Allergen Reactions  . Levofloxacin     REACTION: psychotic nightmares, muscle aches    Past Medical History  Diagnosis Date  . MVP (mitral valve prolapse)   . Lyme disease 2005  . Migraine with aura     Past Surgical History  Procedure Laterality Date  . Inguinal hernia repair  2000    right ( byrnett)  . US echocardiography  12/1990 & 10/2003    MVP/ mild MR normal EF, moderate MR 10/2003  . Cystectomy  12/09    arachnoid    Family History  Problem Relation Age of Onset  . Coronary artery disease Father   . Coronary artery disease Paternal Grandfather   . Diabetes Neg Hx   . Hypertension Neg Hx   . Cancer Neg Hx     History   Social History  . Marital Status: Married    Spouse Name: N/A    Number of Children: 3  . Years of Education: N/A   Occupational History  . president of [employer]    Social History Main Topics  . Smoking status:  Never Smoker   . Smokeless tobacco: Never Used  . Alcohol Use: Yes  . Drug Use: No  . Sexual Activity: Not on file   Other Topics Concern  . Not on file   Social History Narrative  . No narrative on file   Review of Systems  Constitutional: Negative for fatigue and unexpected weight change.       Wears seat belt  HENT: Positive for congestion and tinnitus. Negative for dental problem, hearing loss and rhinorrhea.        Rare tinnitus Regular with dentist  Eyes: Positive for visual disturbance.       No recent vision issue  Respiratory: Negative for cough, chest tightness and shortness of breath.   Cardiovascular: Positive for chest pain and palpitations. Negative for leg swelling.       Went to ER about 6 weeks ago at [facility]--- not heart and sent home No problems playing tennis Palpitations are less frequent  Gastrointestinal: Positive for constipation. Negative for nausea, vomiting, abdominal pain and blood in stool.       Thinks chest pain was fatigue and had eaten too much No regular indigestion Doesn't take meds for constipation (incomplete evacuation)  Endocrine: Negative  for cold intolerance and heat intolerance.  Genitourinary: Negative for urgency, frequency and difficulty urinating.       No sexual problems  Musculoskeletal: Negative for arthralgias, back pain and joint swelling.  Skin: Positive for rash.       Intermittent rash over sternum-- gets discharge at times  Allergic/Immunologic: Negative for environmental allergies and immunocompromised state.  Neurological: Positive for dizziness, numbness and headaches. Negative for syncope, weakness and light-headedness.       No symptoms in months  Hematological: Negative for adenopathy. Does not bruise/bleed easily.  Psychiatric/Behavioral: Positive for sleep disturbance. Negative for dysphoric mood. The patient is not nervous/anxious.        Objective:   Physical Exam  Constitutional: He is oriented to person,  place, and time. He appears well-developed and well-nourished. No distress.  HENT:  Head: Normocephalic and atraumatic.  Right Ear: External ear normal.  Left Ear: External ear normal.  Mouth/Throat: Oropharynx is clear and moist. No oropharyngeal exudate.  Eyes: Conjunctivae and EOM are normal. Pupils are equal, round, and reactive to light.  Neck: Normal range of motion. Neck supple. No thyromegaly present.  Cardiovascular: Normal rate, regular rhythm, normal heart sounds and intact distal pulses.  Exam reveals no gallop.   No murmur heard. Pulmonary/Chest: Effort normal and breath sounds normal. No respiratory distress. He has no wheezes. He has no rales.  Abdominal: Soft. There is no tenderness.  Musculoskeletal: He exhibits no edema and no tenderness.  Lymphadenopathy:    He has no cervical adenopathy.  Neurological: He is alert and oriented to person, place, and time.  Skin: Rash noted.  Flaky red rash over sternum (?TV)  Psychiatric: He has a normal mood and affect. His behavior is normal.          Assessment & Plan:

## 2013-06-03 NOTE — Assessment & Plan Note (Signed)
Healthy His vague syndrome of dizziness, tingling, etc is finally improving Discussed PSA---wait till next year He will do the fecal immunoassay  Defer labs Try selenium on rash

## 2013-06-17 ENCOUNTER — Other Ambulatory Visit (INDEPENDENT_AMBULATORY_CARE_PROVIDER_SITE_OTHER): Payer: BC Managed Care – PPO

## 2013-06-17 DIAGNOSIS — Z1211 Encounter for screening for malignant neoplasm of colon: Secondary | ICD-10-CM

## 2013-06-17 LAB — FECAL OCCULT BLOOD, IMMUNOCHEMICAL: Fecal Occult Bld: NEGATIVE

## 2014-02-21 ENCOUNTER — Ambulatory Visit: Payer: Self-pay

## 2014-02-21 ENCOUNTER — Encounter: Payer: Self-pay | Admitting: Podiatry

## 2014-02-21 ENCOUNTER — Ambulatory Visit (INDEPENDENT_AMBULATORY_CARE_PROVIDER_SITE_OTHER): Payer: Managed Care, Other (non HMO) | Admitting: Podiatry

## 2014-02-21 VITALS — BP 112/71 | HR 67 | Resp 16 | Ht 68.0 in | Wt 180.0 lb

## 2014-02-21 DIAGNOSIS — M7660 Achilles tendinitis, unspecified leg: Secondary | ICD-10-CM

## 2014-02-21 DIAGNOSIS — M779 Enthesopathy, unspecified: Secondary | ICD-10-CM

## 2014-02-21 DIAGNOSIS — M79609 Pain in unspecified limb: Secondary | ICD-10-CM

## 2014-02-21 MED ORDER — DICLOFENAC SODIUM 75 MG PO TBEC: 75.0000 mg | DELAYED_RELEASE_TABLET | Freq: Two times a day (BID) | ORAL | Status: DC

## 2014-02-21 MED ORDER — TRIAMCINOLONE ACETONIDE 10 MG/ML IJ SUSP
10.0000 mg | Freq: Once | INTRAMUSCULAR | Status: AC
Start: 2014-02-21 — End: 2014-02-21
  Administered 2014-02-21: 10 mg

## 2014-02-21 NOTE — Patient Instructions (Signed)

## 2014-02-21 NOTE — Progress Notes (Signed)
   Subjective:    Patient ID: Andrew Porter, male    DOB: 19-Nov-1960, 53 y.o.   MRN: [id number]  HPI Comments: Left back of heel pain, its been going on for about a year, after playing tennis on Sunday it was very difficult to walk on Monday   Foot Pain      Review of Systems  All other systems reviewed and are negative.      Objective:   Physical Exam        Assessment & Plan:

## 2014-02-22 NOTE — Progress Notes (Signed)
Subjective:     Patient ID: Andrew Porter, male   DOB: 08/15/1960, 53 y.o.   MRN: [id number]  Foot Pain   patient points to the right posterior heel stating it's been getting very sore especially after tennis and he has moderate pain on the right but not to the same degree as the left and that this is been going on for a year and worsening   Review of Systems  All other systems reviewed and are negative.      Objective:   Physical Exam  Nursing note and vitals reviewed. Constitutional: He is oriented to person, place, and time.  Cardiovascular: Intact distal pulses.   Musculoskeletal: Normal range of motion.  Neurological: He is oriented to person, place, and time.  Skin: Skin is warm.   neurovascular status is intact with muscle strength adequate and range of motion subtalar midtarsal joint within normal limits he is splinting slightly on his left but does not appear to have significant equinus and he is quite a bit of discomfort in the lateral aspect of the insertion of the Achilles tendon into the left heel moderate in the center noted in the medial and mild discomfort on the right. Digits are well-perfused and arch height mildly diminished     Assessment:     Achilles tendinitis left over right with inflammation and fluid buildup and acute flareup on the lateral side    Plan:     H&P and x-rays reviewed and today I did a careful lateral injection 3 mg Kenalog 5 mg Xylocaine Marcaine mixture and applied air fracture walker. Prior to the procedure I did discuss risk and also dispensed heel lifts and instructions on stretching exercises for the right and will reappoint again in 3 weeks. If everything is good he may resume tennis in approximately 2 weeks

## 2014-02-27 ENCOUNTER — Other Ambulatory Visit: Payer: Self-pay | Admitting: Internal Medicine

## 2014-03-14 ENCOUNTER — Ambulatory Visit: Payer: Managed Care, Other (non HMO) | Admitting: Podiatry

## 2014-05-16 ENCOUNTER — Other Ambulatory Visit: Payer: Self-pay

## 2014-06-06 ENCOUNTER — Ambulatory Visit (INDEPENDENT_AMBULATORY_CARE_PROVIDER_SITE_OTHER): Payer: Private Health Insurance - Indemnity | Admitting: Internal Medicine

## 2014-06-06 ENCOUNTER — Encounter: Payer: Self-pay | Admitting: Internal Medicine

## 2014-06-06 VITALS — BP 110/70 | HR 69 | Temp 98.0°F | Ht 68.0 in | Wt 182.0 lb

## 2014-06-06 DIAGNOSIS — Z1211 Encounter for screening for malignant neoplasm of colon: Secondary | ICD-10-CM

## 2014-06-06 DIAGNOSIS — Z0000 Encounter for general adult medical examination without abnormal findings: Secondary | ICD-10-CM

## 2014-06-06 DIAGNOSIS — Z125 Encounter for screening for malignant neoplasm of prostate: Secondary | ICD-10-CM

## 2014-06-06 DIAGNOSIS — Z23 Encounter for immunization: Secondary | ICD-10-CM

## 2014-06-06 DIAGNOSIS — R5383 Other fatigue: Secondary | ICD-10-CM | POA: Insufficient documentation

## 2014-06-06 LAB — COMPREHENSIVE METABOLIC PANEL
ALBUMIN: 3.8 g/dL (ref 3.5–5.2)
ALK PHOS: 70 U/L (ref 39–117)
ALT: 18 U/L (ref 0–53)
AST: 23 U/L (ref 0–37)
BUN: 20 mg/dL (ref 6–23)
CALCIUM: 9.4 mg/dL (ref 8.4–10.5)
CHLORIDE: 103 meq/L (ref 96–112)
CO2: 26 mEq/L (ref 19–32)
Creatinine, Ser: 1.2 mg/dL (ref 0.4–1.5)
GFR: 70.87 mL/min (ref >60.00)
Glucose, Bld: 82 mg/dL (ref 70–99)
POTASSIUM: 4.2 meq/L (ref 3.5–5.1)
Sodium: 140 mEq/L (ref 135–145)
Total Bilirubin: 1.1 mg/dL (ref 0.2–1.2)
Total Protein: 7.2 g/dL (ref 6.0–8.3)

## 2014-06-06 LAB — CBC WITH DIFFERENTIAL/PLATELET
BASOS ABS: 0 10*3/uL (ref 0.0–0.1)
Basophils Relative: 0.5 % (ref 0.0–3.0)
Eosinophils Absolute: 0.1 10*3/uL (ref 0.0–0.7)
Eosinophils Relative: 1.1 % (ref 0.0–5.0)
HCT: 46.6 % (ref 39.0–52.0)
Hemoglobin: 15.2 g/dL (ref 13.0–17.0)
LYMPHS PCT: 29.8 % (ref 12.0–46.0)
Lymphs Abs: 1.7 10*3/uL (ref 0.7–4.0)
MCHC: 32.5 g/dL (ref 30.0–36.0)
MCV: 97.7 fl (ref 78.0–100.0)
Monocytes Absolute: 0.6 10*3/uL (ref 0.1–1.0)
Monocytes Relative: 10.3 % (ref 3.0–12.0)
Neutro Abs: 3.4 10*3/uL (ref 1.4–7.7)
Neutrophils Relative %: 58.3 % (ref 43.0–77.0)
Platelets: 182 10*3/uL (ref 150.0–400.0)
RBC: 4.77 Mil/uL (ref 4.22–5.81)
RDW: 13.3 % (ref 11.5–15.5)
WBC: 5.8 10*3/uL (ref 4.0–10.5)

## 2014-06-06 LAB — LIPID PANEL
Cholesterol: 191 mg/dL (ref 0–200)
HDL: 51.2 mg/dL (ref >39.00)
LDL CALC: 132 mg/dL — AB (ref 0–99)
NONHDL: 139.8
TRIGLYCERIDES: 40 mg/dL (ref 0.0–149.0)
Total CHOL/HDL Ratio: 4
VLDL: 8 mg/dL (ref 0.0–40.0)

## 2014-06-06 LAB — T4, FREE: Free T4: 0.79 ng/dL (ref 0.60–1.60)

## 2014-06-06 LAB — PSA: PSA: 0.31 ng/mL (ref 0.10–4.00)

## 2014-06-06 NOTE — Assessment & Plan Note (Addendum)
Generally healthy Still with strange neurologic symptoms and rash--but overall better Will check PSA after discussion Fecal immunoassay He is considering zostavax early (will hold off since live vaccine)

## 2014-06-06 NOTE — Assessment & Plan Note (Signed)
Gets sudden feeling of fatigue after dinner and has to sleep Will just check labs

## 2014-06-06 NOTE — Progress Notes (Signed)
Pre visit review using our clinic review tool, if applicable. No additional management support is needed unless otherwise documented below in the visit note. 

## 2014-06-06 NOTE — Progress Notes (Signed)
Subjective:    Patient ID: Andrew Porter, male    DOB: 1961-06-10, 53 y.o.   MRN: [id number]  HPI Here for physical  Still with bilateral Achilles pain Mostly a problem with tennis Has been doing stretching Steady for past 3-4 months  Doing much better overall Old neurologic symptoms continue to ebb Using the magnesium, sleeping better Still with some tingling--but otherwise feels normal at least half the time  Will still "crash" after dinner Just gets urge to lie down and then is "out" for a couple of hours Awakens with severe urgency--goes to bathroom. Then up again for the rest of the evening More noticeable on work days--but will happen on day off also Not every day  Current Outpatient Prescriptions on File Prior to Visit  Medication Sig Dispense Refill  . aspirin 81 MG tablet Take 81 mg by mouth daily.    . Cholecalciferol 4000 UNITS CAPS Take by mouth daily.    . finasteride (PROPECIA) 1 MG tablet TAKE 1 TABLET BY MOUTH DAILY 90 tablet 0  . ibuprofen (ADVIL,MOTRIN) 200 MG tablet Take 200 mg by mouth as needed.    . selenium sulfide (SELSUN) 2.5 % shampoo Apply topically once a week. Apply to rash in evening and wash off in the morning 120 mL 5   No current facility-administered medications on file prior to visit.    Allergies  Allergen Reactions  . Levofloxacin     REACTION: psychotic nightmares, muscle aches    Past Medical History  Diagnosis Date  . MVP (mitral valve prolapse)   . Lyme disease 2005  . Migraine with aura     Past Surgical History  Procedure Laterality Date  . Inguinal hernia repair  2000    right ( byrnett)  . [place] echocardiography  12/1990 & 10/2003    MVP/ mild MR normal EF, moderate MR 10/2003  . Cystectomy  12/09    arachnoid    Family History  Problem Relation Age of Onset  . Coronary artery disease Father   . Coronary artery disease Paternal Grandfather   . Diabetes Neg Hx   . Hypertension Neg Hx   . Cancer Neg Hx      History   Social History  . Marital Status: Married    Spouse Name: N/A    Number of Children: 3  . Years of Education: N/A   Occupational History  . president of [employer]    Social History Main Topics  . Smoking status: Never Smoker   . Smokeless tobacco: Never Used  . Alcohol Use: Yes  . Drug Use: No  . Sexual Activity: Not on file   Other Topics Concern  . Not on file   Social History Narrative   Review of Systems  Constitutional: Negative for fatigue and unexpected weight change.       Wears seat belt  HENT: Negative for dental problem, hearing loss and tinnitus.        Regular with dentist  Eyes: Negative for visual disturbance.       No diplopia or unilateral vision loss Vision is down with his fatigue spells  Respiratory: Negative for cough, chest tightness and shortness of breath.   Cardiovascular: Negative for chest pain, palpitations and leg swelling.  Gastrointestinal: Negative for nausea, vomiting, abdominal pain, constipation and blood in stool.  Endocrine: Positive for heat intolerance and polydipsia.  Genitourinary: Positive for urgency and frequency. Negative for difficulty urinating.  No sexual problems  Musculoskeletal: Positive for back pain. Negative for joint swelling.       Moderate back pain Achilles issues  Skin: Positive for rash.       Still gets rash on chest --comes and goes. Selenium not clearly helpful Rash around nose when more of his neuro symptoms  Allergic/Immunologic: Negative for environmental allergies and immunocompromised state.  Neurological: Negative for syncope, weakness, light-headedness and headaches.       Still gets tingling  Hematological: Negative for adenopathy. Does not bruise/bleed easily.  Psychiatric/Behavioral: Negative for sleep disturbance and dysphoric mood. The patient is not nervous/anxious.        Objective:   Physical Exam  Constitutional: He is oriented to person, place, and  time. He appears well-developed and well-nourished. No distress.  HENT:  Head: Normocephalic and atraumatic.  Right Ear: External ear normal.  Left Ear: External ear normal.  Mouth/Throat: Oropharynx is clear and moist.  Eyes: Conjunctivae and EOM are normal. Pupils are equal, round, and reactive to light.  Neck: Normal range of motion. Neck supple. No thyromegaly present.  Cardiovascular: Normal rate, regular rhythm, normal heart sounds and intact distal pulses.  Exam reveals no gallop.   No murmur heard. Pulmonary/Chest: Effort normal and breath sounds normal. No respiratory distress. He has no wheezes. He has no rales.  Abdominal: Soft. There is no tenderness.  Musculoskeletal: He exhibits no edema or tenderness.  Lymphadenopathy:    He has no cervical adenopathy.  Neurological: He is alert and oriented to person, place, and time.  Skin: No erythema.  Slight rash over sternum and sides of nose  Psychiatric: He has a normal mood and affect. His behavior is normal.          Assessment & Plan:

## 2014-06-06 NOTE — Addendum Note (Signed)
Addended by: Kaelan Amble L on: 08/16/2013 01:01 PM   Modules accepted: Orders  

## 2014-06-23 ENCOUNTER — Other Ambulatory Visit (INDEPENDENT_AMBULATORY_CARE_PROVIDER_SITE_OTHER): Payer: Private Health Insurance - Indemnity

## 2014-06-23 DIAGNOSIS — Z1211 Encounter for screening for malignant neoplasm of colon: Secondary | ICD-10-CM

## 2014-06-23 LAB — FECAL OCCULT BLOOD, IMMUNOCHEMICAL: FECAL OCCULT BLD: NEGATIVE

## 2014-09-10 ENCOUNTER — Other Ambulatory Visit: Payer: Self-pay | Admitting: Internal Medicine

## 2014-09-11 NOTE — Telephone Encounter (Signed)
Last filled 10/14 #90 with 2 refills--last OV was CPE 06/2014--please advise

## 2014-09-11 NOTE — Telephone Encounter (Signed)
Approved: okay to refill for a year 

## 2015-01-05 ENCOUNTER — Other Ambulatory Visit: Payer: Self-pay

## 2015-01-05 DIAGNOSIS — N453 Epididymo-orchitis: Secondary | ICD-10-CM

## 2015-01-12 ENCOUNTER — Telehealth: Payer: Self-pay | Admitting: Radiology

## 2015-06-10 ENCOUNTER — Ambulatory Visit (INDEPENDENT_AMBULATORY_CARE_PROVIDER_SITE_OTHER): Payer: Managed Care, Other (non HMO) | Admitting: Internal Medicine

## 2015-06-10 ENCOUNTER — Encounter: Payer: Self-pay | Admitting: Internal Medicine

## 2015-06-10 VITALS — BP 120/80 | HR 82 | Temp 98.2°F | Ht 68.0 in | Wt 186.0 lb

## 2015-06-10 DIAGNOSIS — Z23 Encounter for immunization: Secondary | ICD-10-CM | POA: Diagnosis not present

## 2015-06-10 DIAGNOSIS — Z0000 Encounter for general adult medical examination without abnormal findings: Secondary | ICD-10-CM

## 2015-06-10 NOTE — Addendum Note (Signed)
Addended by: Sueanne MargaritaSMITH, DESHANNON L on: 06/10/2015 02:58 PM   Modules accepted: Orders

## 2015-06-10 NOTE — Progress Notes (Signed)
Subjective:    Patient ID: Andrew Porter, male    DOB: 04-16-1961, 54 y.o.   MRN: [id number]  HPI Here for physical  Has the same "stuff as before" Getting better Still with tingling all over, excessive muscle fatigue--but doesn't get down so much Feels it might have been mitochondrial toxicity from levofloxacin Still with some Achilles pain--limits tennis to once a week (soreness lasts less time) Has elliptical --but hasn't used much He has neuropathy also--not as bad. At least he is able to sleep despite the tingling now  Still on propecia No ED  Still with recurrent right testicle pain Has seen urologist--- ?epididymitis?  Much less vision episodes--- every 3 months or so Rare pulsating vision times Treating with magnesium, glutathione, B12  Current Outpatient Prescriptions on File Prior to Visit  Medication Sig Dispense Refill  . aspirin 81 MG tablet Take 81 mg by mouth daily.    . Cholecalciferol 4000 UNITS CAPS Take by mouth daily.    . finasteride (PROPECIA) 1 MG tablet TAKE 1 TABLET BY MOUTH DAILY 90 tablet 3   No current facility-administered medications on file prior to visit.    Allergies  Allergen Reactions  . Levofloxacin     REACTION: psychotic nightmares, muscle aches    Past Medical History  Diagnosis Date  . MVP (mitral valve prolapse)   . Lyme disease 2005  . Migraine with aura     Past Surgical History  Procedure Laterality Date  . Inguinal hernia repair  2000    right ( byrnett)  . [place] echocardiography  12/1990 & 10/2003    MVP/ mild MR normal EF, moderate MR 10/2003  . Cystectomy  12/09    arachnoid    Family History  Problem Relation Age of Onset  . Coronary artery disease Father   . Coronary artery disease Paternal Grandfather   . Diabetes Neg Hx   . Hypertension Neg Hx   . Cancer Neg Hx     Social History   Social History  . Marital Status: Married    Spouse Name: N/A  . Number of Children: 3  . Years of  Education: N/A   Occupational History  . president of [employer]    Social History Main Topics  . Smoking status: Never Smoker   . Smokeless tobacco: Never Used  . Alcohol Use: Yes  . Drug Use: No  . Sexual Activity: Not on file   Other Topics Concern  . Not on file   Social History Narrative   Review of Systems  Constitutional: Positive for fatigue. Negative for unexpected weight change.       Wears seat belt  HENT: Negative for dental problem, hearing loss and tinnitus.        Keeps up with dentist  Eyes: Positive for visual disturbance.  Respiratory: Negative for cough, chest tightness and shortness of breath.   Cardiovascular: Negative for palpitations and leg swelling.       Some episodes of left chest pain--not with exertion. Much better now  Gastrointestinal: Negative for nausea, vomiting, abdominal pain, constipation and blood in stool.  Endocrine: Negative for polydipsia and polyuria.  Genitourinary: Negative for urgency, frequency and difficulty urinating.  Musculoskeletal: Positive for myalgias, back pain and arthralgias. Negative for joint swelling.       Uses NSAIDs prn  Skin:       Rashes finally cleared up No suspicious lesions  Allergic/Immunologic: Positive for environmental allergies. Negative for immunocompromised state.  Uses OTC prn  Neurological: Negative for dizziness, syncope, weakness, light-headedness and headaches.  Hematological: Negative for adenopathy. Does not bruise/bleed easily.  Psychiatric/Behavioral: Negative for sleep disturbance and dysphoric mood.       Objective:   Physical Exam  Constitutional: He is oriented to person, place, and time. He appears well-developed and well-nourished. No distress.  HENT:  Head: Normocephalic and atraumatic.  Right Ear: External ear normal.  Left Ear: External ear normal.  Mouth/Throat: Oropharynx is clear and moist. No oropharyngeal exudate.  Eyes: Conjunctivae and EOM are  normal. Pupils are equal, round, and reactive to light.  Neck: Normal range of motion. Neck supple. No thyromegaly present.  Cardiovascular: Normal rate, regular rhythm, normal heart sounds and intact distal pulses.  Exam reveals no gallop.   No murmur heard. Pulmonary/Chest: Effort normal and breath sounds normal. No respiratory distress. He has no wheezes. He has no rales.  Abdominal: Soft. There is no tenderness.  Musculoskeletal: He exhibits no edema or tenderness.  Lymphadenopathy:    He has no cervical adenopathy.  Neurological: He is alert and oriented to person, place, and time.  Skin: No rash noted. No erythema.  Psychiatric: He has a normal mood and affect. His behavior is normal.          Assessment & Plan:

## 2015-06-10 NOTE — Progress Notes (Signed)
Pre visit review using our clinic review tool, if applicable. No additional management support is needed unless otherwise documented below in the visit note. 

## 2015-06-10 NOTE — Assessment & Plan Note (Signed)
Healthy Chronic neuropathy/?tendoninopathy is improving Continue propecia Defer PSA to next year Fecal immunoassay Defer labs

## 2015-09-23 ENCOUNTER — Other Ambulatory Visit: Payer: Self-pay | Admitting: Internal Medicine

## 2015-09-23 NOTE — Telephone Encounter (Signed)
Rx sent electronically.  

## 2016-06-10 ENCOUNTER — Encounter: Payer: Managed Care, Other (non HMO) | Admitting: Internal Medicine

## 2016-06-14 ENCOUNTER — Encounter: Payer: Managed Care, Other (non HMO) | Admitting: Internal Medicine

## 2016-09-08 ENCOUNTER — Emergency Department
Admission: EM | Admit: 2016-09-08 | Discharge: 2016-09-09 | Disposition: A | Discharge status: Home / Self Care | Payer: 59 | Attending: Emergency Medicine | Admitting: Emergency Medicine

## 2016-09-08 ENCOUNTER — Other Ambulatory Visit: Payer: Self-pay

## 2016-09-08 ENCOUNTER — Emergency Department: Payer: 59

## 2016-09-08 ENCOUNTER — Encounter: Payer: Self-pay | Admitting: Emergency Medicine

## 2016-09-08 DIAGNOSIS — Z79899 Other long term (current) drug therapy: Secondary | ICD-10-CM | POA: Insufficient documentation

## 2016-09-08 DIAGNOSIS — R079 Chest pain, unspecified: Secondary | ICD-10-CM | POA: Diagnosis not present

## 2016-09-08 DIAGNOSIS — R0602 Shortness of breath: Secondary | ICD-10-CM | POA: Insufficient documentation

## 2016-09-08 DIAGNOSIS — Z7982 Long term (current) use of aspirin: Secondary | ICD-10-CM | POA: Diagnosis not present

## 2016-09-08 DIAGNOSIS — R5383 Other fatigue: Secondary | ICD-10-CM

## 2016-09-08 LAB — CBC
HEMATOCRIT: 45.5 % (ref 40.0–52.0)
HEMOGLOBIN: 15.4 g/dL (ref 13.0–18.0)
MCH: 32.2 pg (ref 26.0–34.0)
MCHC: 33.8 g/dL (ref 32.0–36.0)
MCV: 95.1 fL (ref 80.0–100.0)
Platelets: 185 10*3/uL (ref 150–440)
RBC: 4.79 MIL/uL (ref 4.40–5.90)
RDW: 12.9 % (ref 11.5–14.5)
WBC: 6.7 10*3/uL (ref 3.8–10.6)

## 2016-09-08 LAB — BASIC METABOLIC PANEL
ANION GAP: 7 (ref 5–15)
BUN: 16 mg/dL (ref 6–20)
CO2: 29 mmol/L (ref 22–32)
Calcium: 9.2 mg/dL (ref 8.9–10.3)
Chloride: 102 mmol/L (ref 101–111)
Creatinine, Ser: 1.09 mg/dL (ref 0.61–1.24)
GFR calc Af Amer: >60 mL/min (ref >60)
GFR calc non Af Amer: >60 mL/min (ref >60)
GLUCOSE: 101 mg/dL — AB (ref 65–99)
POTASSIUM: 4 mmol/L (ref 3.5–5.1)
Sodium: 138 mmol/L (ref 135–145)

## 2016-09-08 LAB — TROPONIN I: Troponin I: <0.03 ng/mL (ref <0.03)

## 2016-09-08 IMAGING — CR DG CHEST 2V
1 series · 2 of 2 positions shown · non-contrast
Comparison: Chest radiograph [DATE]

CLINICAL DATA: Weakness and shortness of breath

EXAM:
CHEST  2 VIEW

[Series 1: dg chest 2 view · 0.14mm/px · 2 of 2 slices shown]
[im 1/2]
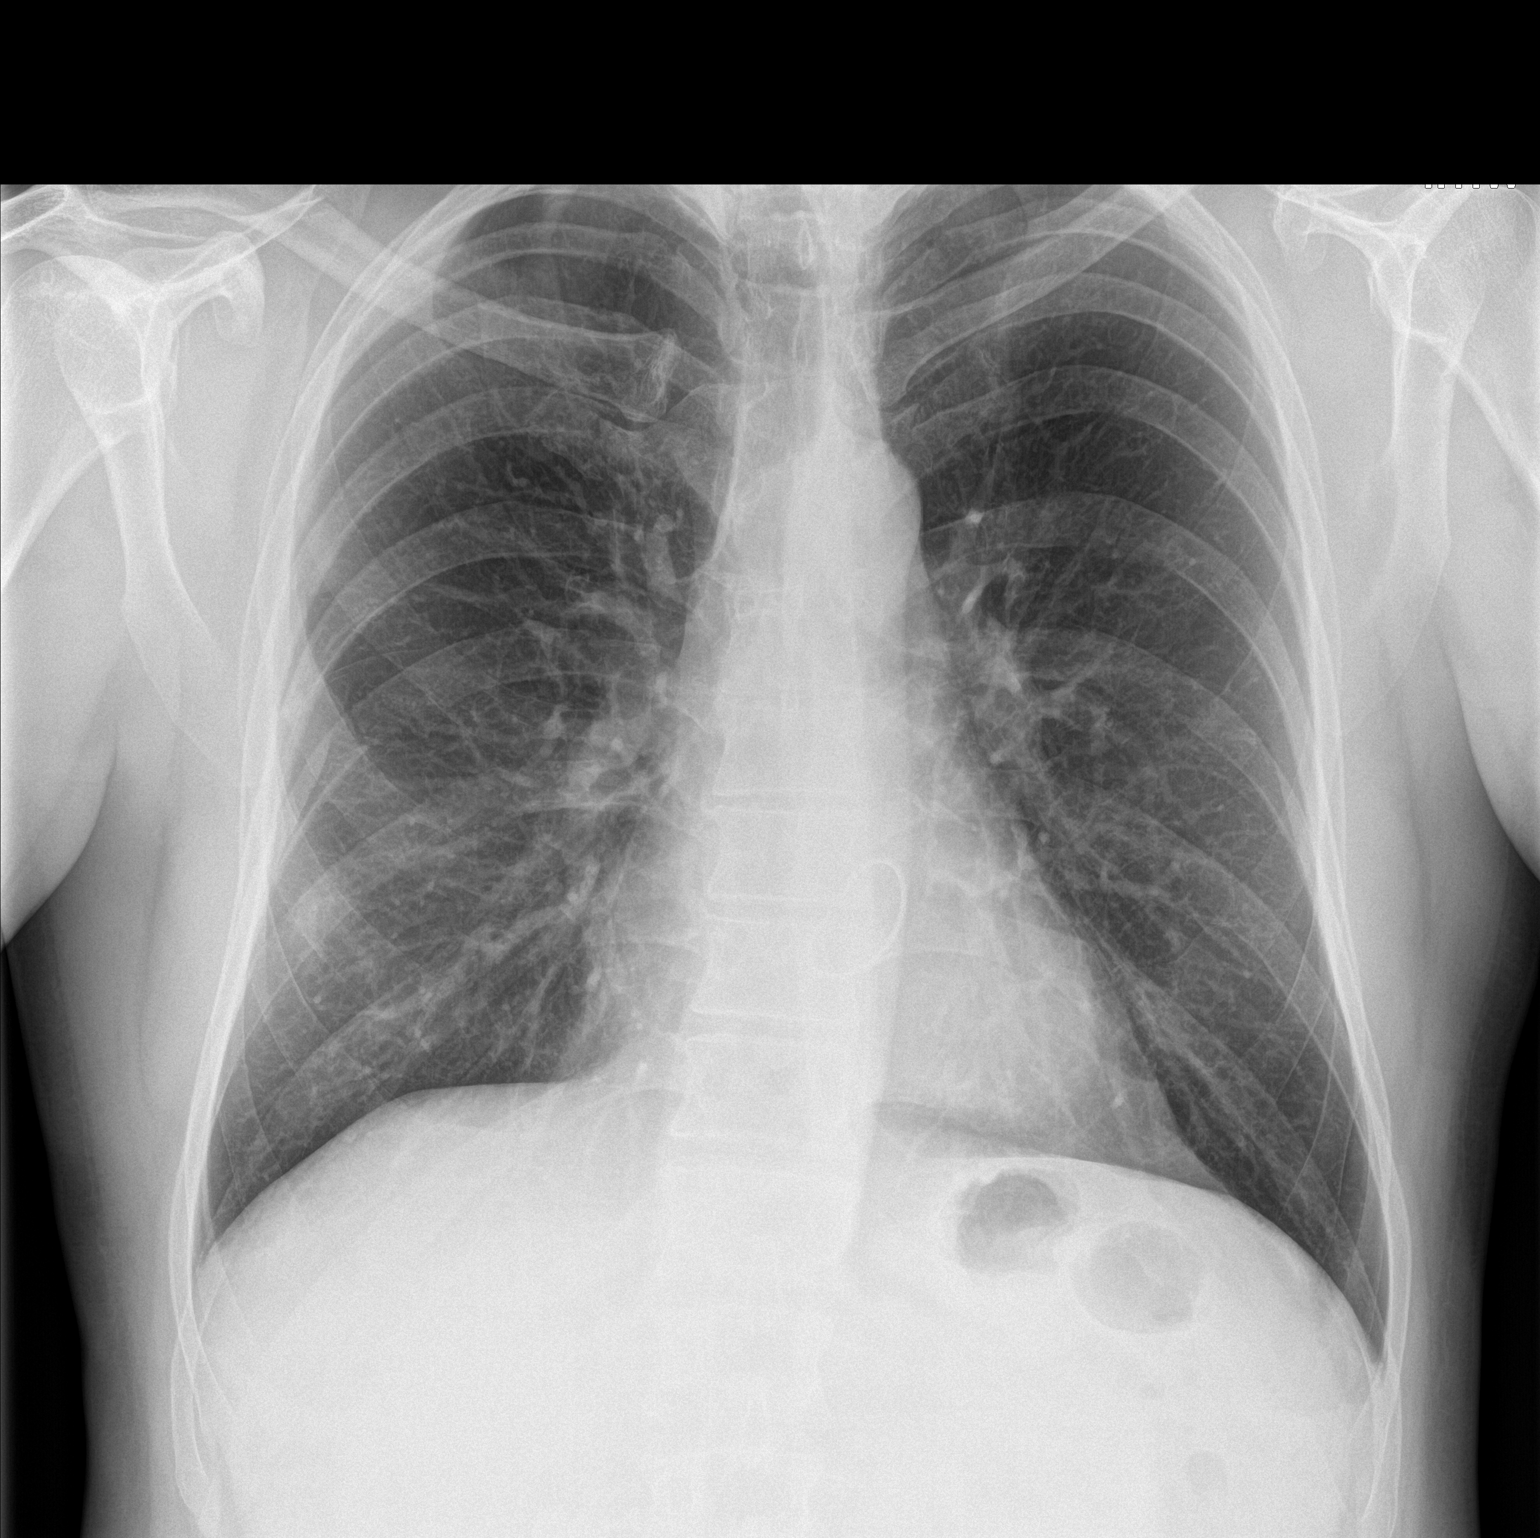
[im 2/2]
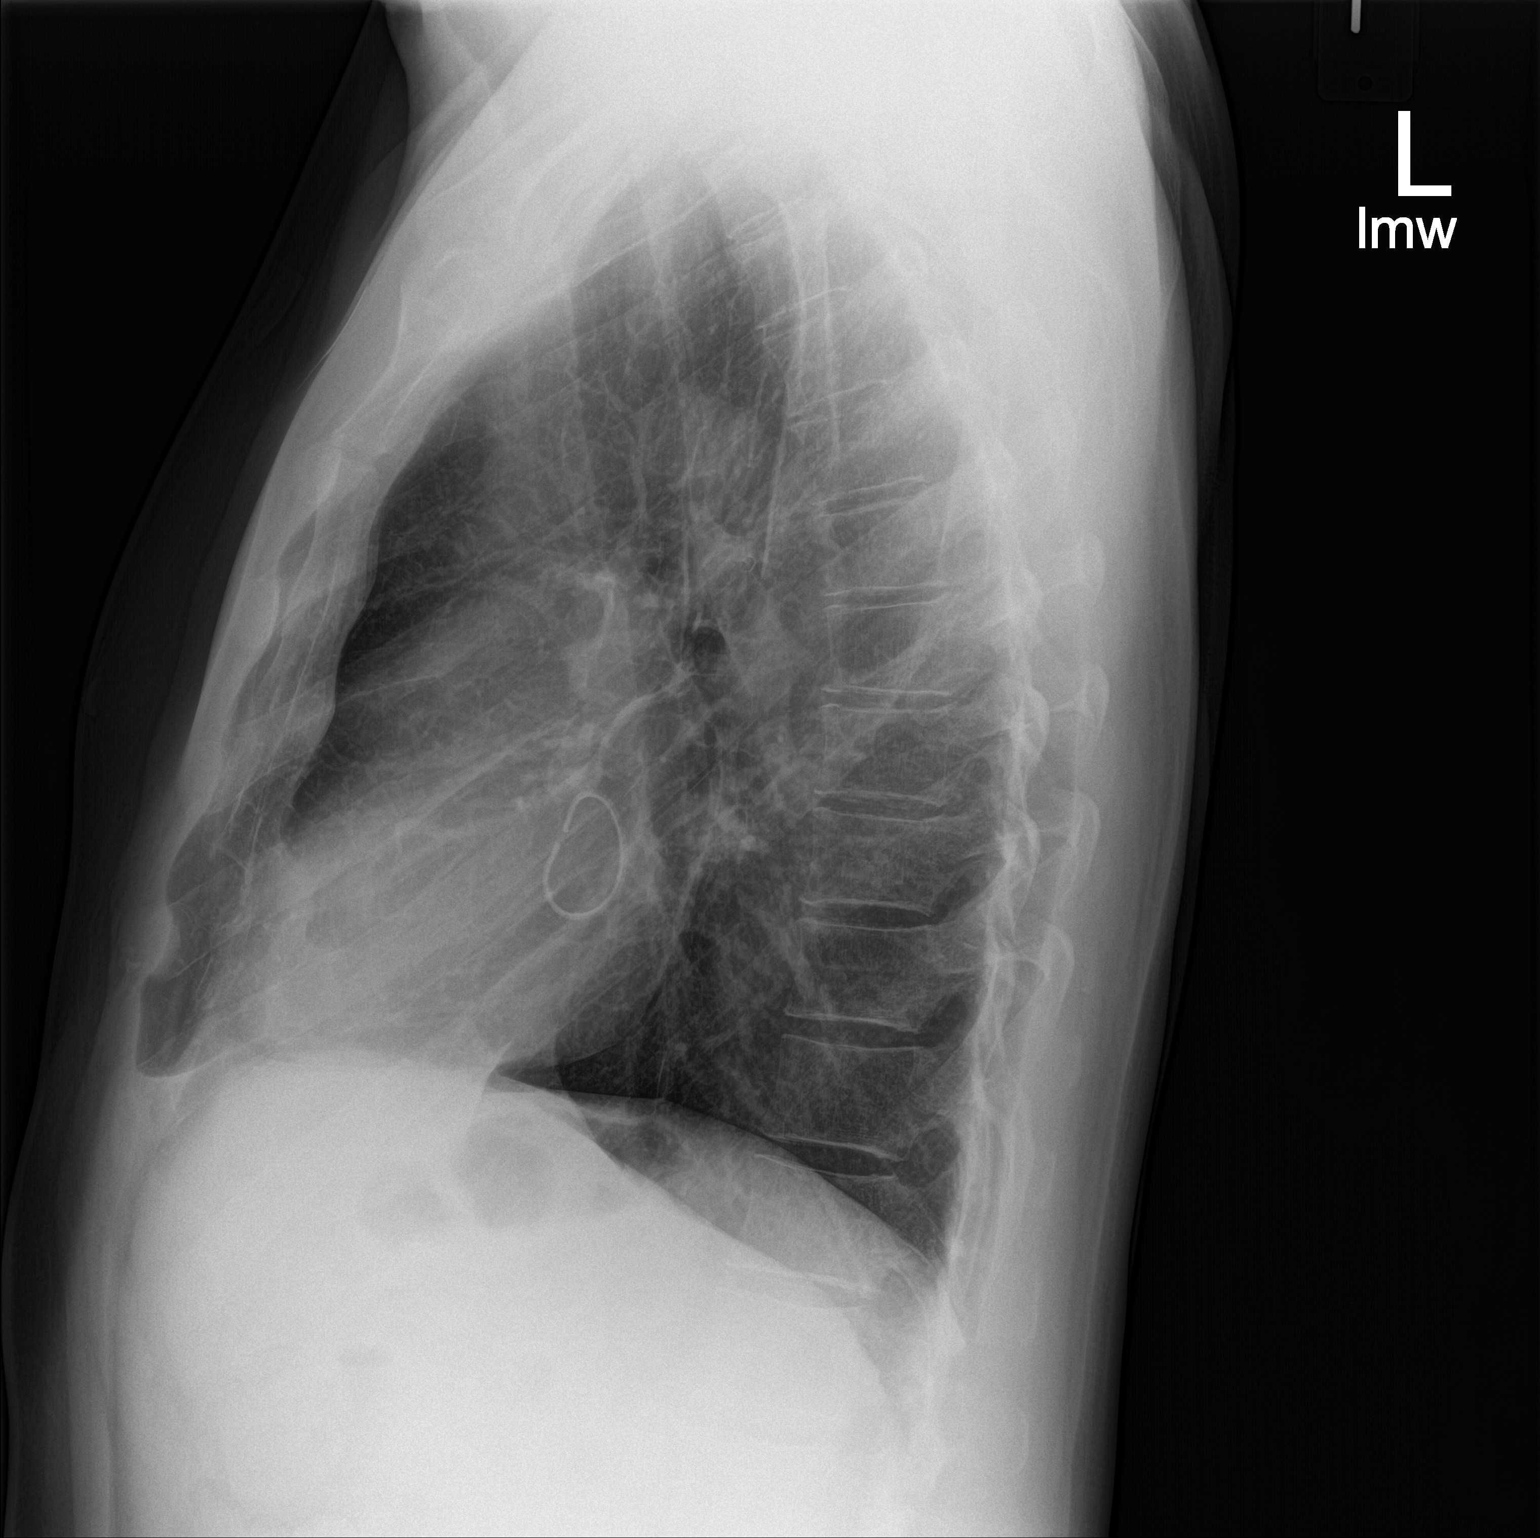

[2 of 2 positions shown; findings below may reference images not displayed]

FINDINGS: Prosthetic mitral valve is again noted. Cardiomediastinal contours
are otherwise normal. No focal airspace consolidation or pulmonary
edema. No pleural effusion or pneumothorax.
IMPRESSION: Clear lungs.

## 2016-09-08 NOTE — ED Provider Notes (Signed)
[facility] Emergency Department Provider Note   First MD Initiated Contact with Patient 09/08/16 2308     (approximate)  I have reviewed the triage vital signs and the nursing notes.   HISTORY  Chief Complaint Weakness; Shortness of Breath; and Chest Pain    HPI Andrew MinerChristopher A Porter is a 56 y.o. male with below list of chronic medical conditions presents to the emergency department with history of generalized weakness fatigue and dyspnea and nausea which started on Sunday. Patient also admits to reproducible left lower chest pain that is nonradiating. Patient denies any pain at present however states that with palpation of the area between his ribs he does indeed have discomfort. Patient states that following playing tennis on Sunday he felt completely fatigue and noted to have a decreased blood pressure at home with a systolic blood pressure of 87. Patient states that his heart rate monitor did not document his heart rate at that time. Patient states that symptoms have progressively improved over the past 4 days however today symptoms recurred.   Past Medical History:  Diagnosis Date  . Lyme disease 2005  . Migraine with aura   . MVP (mitral valve prolapse)     Patient Active Problem List   Diagnosis Date Noted  . Fatigue 06/06/2014  . Routine general medical examination at a health care facility 05/22/2012  . Sleep disturbance 05/22/2012  . Migraine with aura   . MALE PATTERN BALDNESS 10/08/2009    Past Surgical History:  Procedure Laterality Date  . CYSTECTOMY  12/09   arachnoid  . heart valve repair    . INGUINAL HERNIA REPAIR  2000   right ( byrnett)  . US ECHOCARDIOGRAPHY  12/1990 & 10/2003   MVP/ mild MR normal EF, moderate MR 10/2003    Prior to Admission medications   Medication Sig Start Date End Date Taking? Authorizing Provider  aspirin 81 MG tablet Take 81 mg by mouth daily.    Historical Provider, MD  Cholecalciferol 4000 UNITS  CAPS Take by mouth daily.    Historical Provider, MD  finasteride (PROPECIA) 1 MG tablet TAKE 1 TABLET BY MOUTH DAILY 09/23/15   Karie Schwalbeichard I Letvak, MD    Allergies Levofloxacin  Family History  Problem Relation Age of Onset  . Coronary artery disease Father   . Coronary artery disease Paternal Grandfather   . Diabetes Neg Hx   . Hypertension Neg Hx   . Cancer Neg Hx     Social History Social History  Substance Use Topics  . Smoking status: Never Smoker  . Smokeless tobacco: Never Used  . Alcohol use No    Review of Systems Constitutional: No fever/chills Eyes: No visual changes. ENT: No sore throat. Cardiovascular: Denies chest pain. Respiratory: Denies shortness of breath. Gastrointestinal: No abdominal pain.  No nausea, no vomiting.  No diarrhea.  No constipation. Genitourinary: Negative for dysuria. Musculoskeletal: Negative for back pain. Skin: Negative for rash. Neurological: Negative for headaches, focal weakness or numbness.  10-point ROS otherwise negative.  ____________________________________________   PHYSICAL EXAM:  VITAL SIGNS: ED Triage Vitals  Enc Vitals Group     BP 09/08/16 1921 (!) 144/91     Pulse Rate 09/08/16 1921 77     Resp 09/08/16 1921 18     Temp 09/08/16 1921 98 F (36.7 C)     Temp Source 09/08/16 1921 Oral     SpO2 09/08/16 1921 98 %     Weight 09/08/16 1922 185 lb (  83.9 kg)     Height 09/08/16 1922 5\' 11"  (1.803 m)     Head Circumference --      Peak Flow --      Pain Score 09/08/16 1922 5     Pain Loc --      Pain Edu? --      Excl. in GC? --     Constitutional: Alert and oriented. Well appearing and in no acute distress. Eyes: Conjunctivae are normal. PERRL. EOMI. Head: Atraumatic. Ears:  Healthy appearing ear canals and TMs bilaterally Nose: No congestion/rhinnorhea. Mouth/Throat: Mucous membranes are moist Neck: No stridor.  No meningeal signs.   Cardiovascular: Normal rate, regular rhythm. Good peripheral  circulation. Grossly normal heart sounds. Respiratory: Normal respiratory effort.  No retractions. Lungs CTAB. Gastrointestinal: Soft and nontender. No distention.  Musculoskeletal: No lower extremity tenderness nor edema. No gross deformities of extremities. Neurologic:  Normal speech and language. No gross focal neurologic deficits are appreciated.  Skin:  Skin is warm, dry and intact. No rash noted. Psychiatric: Mood and affect are normal. Speech and behavior are normal.  ____________________________________________   LABS (all labs ordered are listed, but only abnormal results are displayed)  Labs Reviewed  BASIC METABOLIC PANEL - Abnormal; Notable for the following:       Result Value   Glucose, Bld 101 (*)    All other components within normal limits  CBC  TROPONIN I  INFLUENZA PANEL BY PCR (TYPE A & B)  TROPONIN I   ____________________________________________  EKG  ED ECG REPORT I, [place] Glendell Fouse, the attending physician, personally viewed and interpreted this ECG.   Date: 09/09/2016  EKG Time: 11:24 PM  Rate: 75  Rhythm: Normal sinus rhythm  Axis: Normal  Intervals: Normal  ST&T Change:   ____________________________________________  RADIOLOGY I, [place] Dewayne Shorter, personally viewed and evaluated these images (plain radiographs) as part of my medical decision making, as well as reviewing the written report by the radiologist.  Dg Chest 2 View  Result Date: 09/08/2016 CLINICAL DATA:  Weakness and shortness of breath EXAM: CHEST  2 VIEW COMPARISON:  Chest radiograph 05/10/2013 FINDINGS: Prosthetic mitral valve is again noted. Cardiomediastinal contours are otherwise normal. No focal airspace consolidation or pulmonary edema. No pleural effusion or pneumothorax. IMPRESSION: Clear lungs. Electronically Signed   By: Deatra Robinson M.D.   On: 09/08/2016 19:39      Procedures     INITIAL IMPRESSION / ASSESSMENT AND PLAN / ED COURSE  Pertinent labs & imaging  results that were available during my care of the patient were reviewed by me and considered in my medical decision making (see chart for details).  No clear etiology for the patient's fatigue and chest discomfort identified. Considered possibly of Cardec ischemia or infarction however EKG revealed no evidence of ST segment elevation or depression troponin negative 2. Considered possibility of pulmonary emboli given dyspnea with chest pain however d-dimer normal at 466. Patient is advised to follow-up with cardiologist today for further outpatient evaluation and management.     ____________________________________________  FINAL CLINICAL IMPRESSION(S) / ED DIAGNOSES  Final diagnoses:  Chest pain, unspecified type  Fatigue, unspecified type     MEDICATIONS GIVEN DURING THIS VISIT:  Medications - No data to display   NEW OUTPATIENT MEDICATIONS STARTED DURING THIS VISIT:  New Prescriptions   No medications on file    Modified Medications   No medications on file    Discontinued Medications   No medications on file  Note:  This document was prepared using Dragon voice recognition software and may include unintentional dictation errors.    Darci Current, MD 09/09/16 [phone]

## 2016-09-08 NOTE — ED Notes (Signed)
Pt ambulated to toilet by self, denied weakness or dizziness at that time. This RN stepped out because pt asked this RN to do so. Pt ambulated with steady gait to toilet.   Pt then hooked up to cardiac monitor upon completion of urination.

## 2016-09-08 NOTE — ED Notes (Signed)
Family ask first nurse if pt could have an asa.  Per Dr. Sharma CovertNorman no asa at this time.  Pt aware.

## 2016-09-08 NOTE — ED Triage Notes (Signed)
Patient reports that he has been feeling weak, shortness of breath, nausea and chest pain that started on Sunday.

## 2016-09-08 NOTE — ED Notes (Signed)
Pt given pillow, turned lights down and given cup of water.

## 2016-09-08 NOTE — ED Notes (Signed)
Pt states Sunday had sudden onset of weakness with couple BP readings in 80s-90s. Normally runs 120/80 or so. Pt states losing his breath with walking the past few days and some nausea. Pt states weakness began Sunday and laid in bed until today when went to son's swim meet. Pt was driving home from swim meet and sudden onset of arm tingling (L more than R). Pt states symptoms have resolved, only c/o now is R sided migraine. Pt has hx of migraine. States blurred vision starts first then headache comes on. PT states he is feeling much better than when he came in. Alert and oriented, no distress noted at this time.

## 2016-09-09 ENCOUNTER — Telehealth: Payer: Self-pay

## 2016-09-09 LAB — INFLUENZA PANEL BY PCR (TYPE A & B)
INFLAPCR: NEGATIVE
Influenza B By PCR: NEGATIVE

## 2016-09-09 LAB — FIBRIN DERIVATIVES D-DIMER (ARMC ONLY): Fibrin derivatives D-dimer (ARMC): 466 (ref 0–499)

## 2016-09-09 LAB — TROPONIN I: Troponin I: <0.03 ng/mL (ref <0.03)

## 2016-09-09 MED ORDER — ACETAMINOPHEN 325 MG PO TABS
650.0000 mg | ORAL_TABLET | Freq: Once | ORAL | Status: AC
  Administered 2016-09-09: 650 mg via ORAL
  Filled 2016-09-09: qty 2

## 2016-09-09 NOTE — ED Notes (Signed)
Pt eating at this time without difficulty noted.

## 2016-09-09 NOTE — Telephone Encounter (Signed)
Spoke to pt. He is scheduling with his cardiologist

## 2016-09-13 ENCOUNTER — Other Ambulatory Visit: Payer: Self-pay | Admitting: Internal Medicine

## 2016-09-19 DIAGNOSIS — Z9889 Other specified postprocedural states: Secondary | ICD-10-CM | POA: Diagnosis not present

## 2016-09-21 DIAGNOSIS — Z9889 Other specified postprocedural states: Secondary | ICD-10-CM | POA: Diagnosis not present

## 2016-09-27 ENCOUNTER — Ambulatory Visit (INDEPENDENT_AMBULATORY_CARE_PROVIDER_SITE_OTHER): Payer: 59 | Admitting: Internal Medicine

## 2016-09-27 ENCOUNTER — Encounter: Payer: Self-pay | Admitting: Internal Medicine

## 2016-09-27 VITALS — BP 122/70 | HR 70 | Temp 98.2°F | Ht 70.0 in | Wt 185.0 lb

## 2016-09-27 DIAGNOSIS — Z0000 Encounter for general adult medical examination without abnormal findings: Secondary | ICD-10-CM | POA: Diagnosis not present

## 2016-09-27 DIAGNOSIS — Z125 Encounter for screening for malignant neoplasm of prostate: Secondary | ICD-10-CM

## 2016-09-27 DIAGNOSIS — Z1211 Encounter for screening for malignant neoplasm of colon: Secondary | ICD-10-CM

## 2016-09-27 LAB — PSA: PSA: 0.45 ng/mL (ref 0.10–4.00)

## 2016-09-27 NOTE — Progress Notes (Signed)
Subjective:    Patient ID: Andrew Porter, male    DOB: Jul 12, 1961, 56 y.o.   MRN: [id number]  HPI Here for physical  Recent ER visit and follow up with cardiology No evidence of ischemia or trouble with the valve  Has ongoing sensory issues Flares at times Had sensed flare coming on and still played tennis (couldn't find sub)---dizzy and felt bad on the way home To ER later when he just "couldn't move at all" and BP was low (89/59) and stayed in bed for 3 days (and got weak again [place] and [place] chest pain). Not quite back to his normal--tingling all over, trouble sleeping, pain Overall, feels this years long process is slowly improving  Always tender in both abdominal LQs--no bowel problems, etc  Has rash on side of nose--left more than right Associated with the spells he has Does use cortisone cream for this  Current Outpatient Prescriptions on File Prior to Visit  Medication Sig Dispense Refill  . aspirin 81 MG tablet Take 81 mg by mouth daily.    . finasteride (PROPECIA) 1 MG tablet TAKE 1 TABLET BY MOUTH DAILY 90 tablet 0   No current facility-administered medications on file prior to visit.     Allergies  Allergen Reactions  . Levofloxacin     REACTION: psychotic nightmares, muscle aches    Past Medical History:  Diagnosis Date  . Lyme disease 2005  . Migraine with aura   . MVP (mitral valve prolapse)     Past Surgical History:  Procedure Laterality Date  . CYSTECTOMY  12/09   arachnoid  . heart valve repair    . INGUINAL HERNIA REPAIR  2000   right ( byrnett)  . US ECHOCARDIOGRAPHY  12/1990 & 10/2003   MVP/ mild MR normal EF, moderate MR 10/2003    Family History  Problem Relation Age of Onset  . Coronary artery disease Father   . Coronary artery disease Paternal Grandfather   . Diabetes Neg Hx   . Hypertension Neg Hx   . Cancer Neg Hx     Social History   Social History  . Marital status: Married   Spouse name: N/A  . Number of children: 3  . Years of education: N/A   Occupational History  . president of [employer]    Social History Main Topics  . Smoking status: Never Smoker  . Smokeless tobacco: Never Used  . Alcohol use No  . Drug use: No  . Sexual activity: Not on file   Other Topics Concern  . Not on file   Social History Narrative  . No narrative on file   Review of Systems  Constitutional: Negative for unexpected weight change.       Wears seat belt  HENT: Negative for dental problem, hearing loss and tinnitus.        Keeps up with dentist  Eyes: Positive for visual disturbance.       No diplopia or unilateral vision loss  Respiratory: Negative for cough, chest tightness and shortness of breath.   Cardiovascular: Positive for chest pain. Negative for palpitations and leg swelling.  Gastrointestinal: Negative for constipation, diarrhea and nausea.  Endocrine: Negative for polydipsia and polyuria.  Genitourinary:       Increased nocturia  Flow is fine No ED  Musculoskeletal: Positive for arthralgias and myalgias. Negative for back pain.  Skin: Positive for rash.       Seborrhea like facial  rash No suspicious lesions  Allergic/Immunologic: Positive for environmental allergies. Negative for immunocompromised state.       Uses loratadine  Neurological: Positive for light-headedness and numbness. Negative for syncope.  Hematological: Negative for adenopathy. Does not bruise/bleed easily.  Psychiatric/Behavioral: Negative for dysphoric mood and sleep disturbance. The patient is not nervous/anxious.        Objective:   Physical Exam  Constitutional: He is oriented to person, place, and time. He appears well-developed and well-nourished. No distress.  HENT:  Head: Normocephalic and atraumatic.  Right Ear: External ear normal.  Left Ear: External ear normal.  Mouth/Throat: Oropharynx is clear and moist. No oropharyngeal exudate.  Eyes:  Conjunctivae are normal. Pupils are equal, round, and reactive to light.  Neck: Normal range of motion. Neck supple. No thyromegaly present.  Cardiovascular: Normal rate, regular rhythm, normal heart sounds and intact distal pulses.  Exam reveals no gallop.   No murmur heard. Pulmonary/Chest: Effort normal and breath sounds normal. No respiratory distress. He has no wheezes. He has no rales.  Abdominal: Soft. There is no tenderness.  Musculoskeletal: He exhibits no edema or tenderness.  Lymphadenopathy:    He has no cervical adenopathy.  Neurological: He is alert and oriented to person, place, and time.  Skin:  Slight red rash to left side of nose  Psychiatric: He has a normal mood and affect. His behavior is normal.          Assessment & Plan:

## 2016-09-27 NOTE — Progress Notes (Signed)
Pre visit review using our clinic review tool, if applicable. No additional management support is needed unless otherwise documented below in the visit note. 

## 2016-09-27 NOTE — Assessment & Plan Note (Signed)
Healthy Still has his undefined neurosensory condition that is undiagnosed Overall better---but with severe spell recently  Will check PSA after discussion Fecal immunoassay

## 2016-11-02 ENCOUNTER — Other Ambulatory Visit (INDEPENDENT_AMBULATORY_CARE_PROVIDER_SITE_OTHER): Payer: 59

## 2016-11-02 DIAGNOSIS — Z1211 Encounter for screening for malignant neoplasm of colon: Secondary | ICD-10-CM

## 2016-11-02 LAB — FECAL OCCULT BLOOD, IMMUNOCHEMICAL: Fecal Occult Bld: NEGATIVE

## 2016-12-12 ENCOUNTER — Other Ambulatory Visit: Payer: Self-pay | Admitting: Internal Medicine

## 2017-05-18 DIAGNOSIS — L814 Other melanin hyperpigmentation: Secondary | ICD-10-CM | POA: Diagnosis not present

## 2017-05-18 DIAGNOSIS — L821 Other seborrheic keratosis: Secondary | ICD-10-CM | POA: Diagnosis not present

## 2017-05-18 DIAGNOSIS — L218 Other seborrheic dermatitis: Secondary | ICD-10-CM | POA: Diagnosis not present

## 2017-06-28 ENCOUNTER — Encounter: Payer: Self-pay | Admitting: Internal Medicine

## 2017-06-28 ENCOUNTER — Ambulatory Visit (INDEPENDENT_AMBULATORY_CARE_PROVIDER_SITE_OTHER)
Admission: RE | Admit: 2017-06-28 | Discharge: 2017-06-28 | Disposition: A | Discharge status: Home / Self Care | Payer: 59 | Source: Ambulatory Visit | Attending: Internal Medicine | Admitting: Internal Medicine

## 2017-06-28 ENCOUNTER — Other Ambulatory Visit (INDEPENDENT_AMBULATORY_CARE_PROVIDER_SITE_OTHER): Payer: 59

## 2017-06-28 ENCOUNTER — Ambulatory Visit: Payer: 59 | Admitting: Internal Medicine

## 2017-06-28 VITALS — BP 106/62 | HR 78 | Temp 98.2°F | Wt 181.0 lb

## 2017-06-28 DIAGNOSIS — Z23 Encounter for immunization: Secondary | ICD-10-CM

## 2017-06-28 DIAGNOSIS — R1033 Periumbilical pain: Secondary | ICD-10-CM

## 2017-06-28 DIAGNOSIS — K824 Cholesterolosis of gallbladder: Secondary | ICD-10-CM | POA: Diagnosis not present

## 2017-06-28 LAB — COMPREHENSIVE METABOLIC PANEL
ALBUMIN: 4.5 g/dL (ref 3.5–5.2)
ALT: 17 U/L (ref 0–53)
AST: 22 U/L (ref 0–37)
Alkaline Phosphatase: 60 U/L (ref 39–117)
BUN: 19 mg/dL (ref 6–23)
CALCIUM: 9.9 mg/dL (ref 8.4–10.5)
CO2: 31 mEq/L (ref 19–32)
Chloride: 102 mEq/L (ref 96–112)
Creatinine, Ser: 1.21 mg/dL (ref 0.40–1.50)
GFR: 66.06 mL/min (ref >60.00)
Glucose, Bld: 95 mg/dL (ref 70–99)
POTASSIUM: 3.9 meq/L (ref 3.5–5.1)
Sodium: 138 mEq/L (ref 135–145)
Total Bilirubin: 0.7 mg/dL (ref 0.2–1.2)
Total Protein: 7.7 g/dL (ref 6.0–8.3)

## 2017-06-28 LAB — CBC
HEMATOCRIT: 46.3 % (ref 39.0–52.0)
HEMOGLOBIN: 15.4 g/dL (ref 13.0–17.0)
MCHC: 33.2 g/dL (ref 30.0–36.0)
MCV: 98 fl (ref 78.0–100.0)
PLATELETS: 192 10*3/uL (ref 150.0–400.0)
RBC: 4.73 Mil/uL (ref 4.22–5.81)
RDW: 13.2 % (ref 11.5–15.5)
WBC: 8.3 10*3/uL (ref 4.0–10.5)

## 2017-06-28 LAB — SEDIMENTATION RATE: SED RATE: 2 mm/h (ref 0–20)

## 2017-06-28 MED ORDER — IOPAMIDOL (ISOVUE-300) INJECTION 61%
100.0000 mL | Freq: Once | INTRAVENOUS | Status: AC | PRN
  Administered 2017-06-28: 100 mL via INTRAVENOUS

## 2017-06-28 NOTE — Patient Instructions (Signed)
Please stop the aspirin for now. Start omeprazole 20mg  daily on an empty stomach daily (over the counter)

## 2017-06-28 NOTE — Progress Notes (Signed)
Subjective:    Patient ID: Andrew Porter, male    DOB: [birth year], 56 y.o.   MRN: [id number]  HPI Here due to abdominal pain  Has had some epigastric tenderness Has "stayed around a long time" Pain even radiates to his back  Goes back 2 months Constant aching Not better after eating No N/V  Hasn't tried anything--- but had been on tums regularly for some time but stopped Thought it might be causing the problem--so he stopped Takes asa 325 every other day No other OTC pain relievers  Current Outpatient Medications on File Prior to Visit  Medication Sig Dispense Refill  . aspirin 81 MG tablet Take 81 mg by mouth daily.    . Calcium-Magnesium-Vitamin D (CALCIUM MAGNESIUM PO) Take by mouth.    . Cyanocobalamin (B-12) 5000 MCG SUBL Place under the tongue.    . finasteride (PROPECIA) 1 MG tablet TAKE 1 TABLET BY MOUTH DAILY 90 tablet 3  . Multiple Vitamin (MULTIVITAMIN) capsule Take 2 capsules by mouth daily.     No current facility-administered medications on file prior to visit.     Allergies  Allergen Reactions  . Levofloxacin     REACTION: psychotic nightmares, muscle aches    Past Medical History:  Diagnosis Date  . Lyme disease 2005  . Migraine with aura   . MVP (mitral valve prolapse)     Past Surgical History:  Procedure Laterality Date  . CYSTECTOMY  12/09   arachnoid  . heart valve repair    . INGUINAL HERNIA REPAIR  2000   right ( byrnett)  . US ECHOCARDIOGRAPHY  12/1990 & 10/2003   MVP/ mild MR normal EF, moderate MR 10/2003    Family History  Problem Relation Age of Onset  . Coronary artery disease Father   . Coronary artery disease Paternal Grandfather   . Diabetes Neg Hx   . Hypertension Neg Hx   . Cancer Neg Hx     Social History   Socioeconomic History  . Marital status: Married    Spouse name: Not on file  . Number of children: 3  . Years of education: Not on file  . Highest education level: Not on file  Social Needs  .  Financial resource strain: Not on file  . Food insecurity - worry: Not on file  . Food insecurity - inability: Not on file  . Transportation needs - medical: Not on file  . Transportation needs - non-medical: Not on file  Occupational History  . Occupation: Civil engineer, contracting[employer]  Tobacco Use  . Smoking status: Never Smoker  . Smokeless tobacco: Never Used  Substance and Sexual Activity  . Alcohol use: No  . Drug use: No  . Sexual activity: Not on file  Other Topics Concern  . Not on file  Social History Narrative  . Not on file   Review of Systems Bowels are fine No blood in stool Appetite is fine Working on weight and has lost some No fevers    Objective:   Physical Exam  Constitutional: He appears well-nourished. No distress.  Neck: No thyromegaly present.  Cardiovascular: Normal rate, regular rhythm and normal heart sounds. Exam reveals no gallop.  No murmur heard. Pulmonary/Chest: Effort normal and breath sounds normal. No respiratory distress. He has no wheezes. He has no rales.  Abdominal: Soft. Bowel sounds are normal. He exhibits no distension and no mass. There is no rebound and no guarding.  Mild-moderate periumbilical tenderness  Musculoskeletal:  He exhibits no edema.  Lymphadenopathy:    He has no cervical adenopathy.  Psychiatric: He has a normal mood and affect. His behavior is normal.          Assessment & Plan:

## 2017-06-28 NOTE — Assessment & Plan Note (Signed)
Going on for 2 months Pointed to stomach area but symptoms not consistent with PUD--though still possible. Not really reflux related Not suggestive of gallbladder disease My concern with pain radiating to back is pancreas ---will proceed with CT scan Check labs as well

## 2017-06-29 LAB — H. PYLORI ANTIBODY, IGG: H PYLORI IGG: NEGATIVE

## 2017-09-29 ENCOUNTER — Encounter: Payer: 59 | Admitting: Internal Medicine

## 2017-12-12 ENCOUNTER — Other Ambulatory Visit: Payer: Self-pay | Admitting: Internal Medicine

## 2018-01-03 ENCOUNTER — Ambulatory Visit (INDEPENDENT_AMBULATORY_CARE_PROVIDER_SITE_OTHER): Payer: 59 | Admitting: Internal Medicine

## 2018-01-03 ENCOUNTER — Encounter: Payer: Self-pay | Admitting: Internal Medicine

## 2018-01-03 VITALS — BP 110/80 | HR 66 | Temp 98.1°F | Ht 70.25 in | Wt 177.0 lb

## 2018-01-03 DIAGNOSIS — Z1211 Encounter for screening for malignant neoplasm of colon: Secondary | ICD-10-CM | POA: Diagnosis not present

## 2018-01-03 DIAGNOSIS — N400 Enlarged prostate without lower urinary tract symptoms: Secondary | ICD-10-CM | POA: Insufficient documentation

## 2018-01-03 DIAGNOSIS — R350 Frequency of micturition: Secondary | ICD-10-CM | POA: Diagnosis not present

## 2018-01-03 DIAGNOSIS — N401 Enlarged prostate with lower urinary tract symptoms: Secondary | ICD-10-CM

## 2018-01-03 DIAGNOSIS — Z0000 Encounter for general adult medical examination without abnormal findings: Secondary | ICD-10-CM | POA: Diagnosis not present

## 2018-01-03 MED ORDER — FINASTERIDE 5 MG PO TABS: 5.0000 mg | ORAL_TABLET | Freq: Every day | ORAL | 3 refills | Status: DC

## 2018-01-03 NOTE — Progress Notes (Signed)
Subjective:    Patient ID: Andrew Porter, male    DOB: [birth year], 57 y.o.   MRN: [id number]  HPI Here for physical Generally doing okay  Has noticed some increased urinary frequency Up at night more---but not much amount No dysuria, hematuria or sexual problems  Notices ongoing improvement in neurosensory symptoms Stamina has continued to improve No headaches  Playing tennis regularly Same business  Current Outpatient Medications on File Prior to Visit  Medication Sig Dispense Refill  . aspirin 81 MG tablet Take 81 mg by mouth daily.    . Calcium-Magnesium-Vitamin D (CALCIUM MAGNESIUM PO) Take by mouth.    . Cyanocobalamin (B-12) 5000 MCG SUBL Place under the tongue.    . Multiple Vitamin (MULTIVITAMIN) capsule Take 2 capsules by mouth daily.     No current facility-administered medications on file prior to visit.     Allergies  Allergen Reactions  . Levofloxacin     REACTION: psychotic nightmares, muscle aches    Past Medical History:  Diagnosis Date  . Lyme disease 2005  . Migraine with aura   . MVP (mitral valve prolapse)     Past Surgical History:  Procedure Laterality Date  . CYSTECTOMY  12/09   arachnoid  . heart valve repair    . INGUINAL HERNIA REPAIR  2000   right ( [facility])  . US ECHOCARDIOGRAPHY  12/1990 & 10/2003   MVP/ mild MR normal EF, moderate MR 10/2003    Family History  Problem Relation Age of Onset  . Coronary artery disease Father   . Coronary artery disease Paternal Grandfather   . Diabetes Neg Hx   . Hypertension Neg Hx   . Cancer Neg Hx     Social History   Socioeconomic History  . Marital status: Married    Spouse name: Not on file  . Number of children: 3  . Years of education: Not on file  . Highest education level: Not on file  Occupational History  . Occupation: Civil engineer, [employer]  Social Needs  . Financial resource strain: Not on file  . Food insecurity:    Worry: Not on file   Inability: Not on file  . Transportation needs:    Medical: Not on file    Non-medical: Not on file  Tobacco Use  . Smoking status: Never Smoker  . Smokeless tobacco: Never Used  Substance and Sexual Activity  . Alcohol use: No  . Drug use: No  . Sexual activity: Not on file  Lifestyle  . Physical activity:    Days per week: Not on file    Minutes per session: Not on file  . Stress: Not on file  Relationships  . Social connections:    Talks on phone: Not on file    Gets together: Not on file    Attends religious service: Not on file    Active member of club or organization: Not on file    Attends meetings of clubs or organizations: Not on file    Relationship status: Not on file  . Intimate partner violence:    Fear of current or ex partner: Not on file    Emotionally abused: Not on file    Physically abused: Not on file    Forced sexual activity: Not on file  Other Topics Concern  . Not on file  Social History Narrative  . Not on file   Review of Systems  Constitutional: Negative for fatigue and unexpected weight change.  Wears seat belt  HENT: Negative for dental problem, hearing loss and tinnitus.        Keeps up with detist  Eyes: Negative for visual disturbance.       Vision problems mostly gone No diplopia, etc Rare vision loss with flashing lights now  Respiratory: Negative for cough, chest tightness and shortness of breath.   Cardiovascular: Negative for chest pain, palpitations and leg swelling.  Gastrointestinal: Negative for abdominal pain, blood in stool and constipation.       No heartburn  Endocrine: Negative for polydipsia and polyuria.  Genitourinary: Positive for difficulty urinating and urgency.  Musculoskeletal: Negative for arthralgias, back pain and joint swelling.  Skin:       Skin rash mostly gone No suspicious lesions---has seen derm in past  Neurological: Negative for dizziness, syncope and light-headedness.       Rare headaches    Hematological: Negative for adenopathy. Does not bruise/bleed easily.  Psychiatric/Behavioral: Negative for dysphoric mood and sleep disturbance. The patient is not nervous/anxious.        Objective:   Physical Exam  Constitutional: He is oriented to person, place, and time. He appears well-developed and well-nourished. No distress.  HENT:  Head: Normocephalic and atraumatic.  Right Ear: External ear normal.  Left Ear: External ear normal.  Mouth/Throat: Oropharynx is clear and moist. No oropharyngeal exudate.  Eyes: Pupils are equal, round, and reactive to light. Conjunctivae are normal.  Neck: No thyromegaly present.  Cardiovascular: Normal rate, regular rhythm, normal heart sounds and intact distal pulses. Exam reveals no gallop.  No murmur heard. Respiratory: Effort normal and breath sounds normal. No respiratory distress. He has no wheezes. He has no rales.  GI: Soft. There is no tenderness.  Musculoskeletal: He exhibits no edema or tenderness.  Lymphadenopathy:    He has no cervical adenopathy.  Neurological: He is alert and oriented to person, place, and time.  Skin: No rash noted. No erythema.  Psychiatric: He has a normal mood and affect. His behavior is normal.           Assessment & Plan:

## 2018-01-03 NOTE — Assessment & Plan Note (Signed)
Will increase the finasteride Consider tamsulosin if not better

## 2018-01-03 NOTE — Assessment & Plan Note (Signed)
Doing great! Feels the best he has in years Consider PSA next year FIT Yearly flu vaccine Discussed shingrix---will hold off for now Consider recheck lipids next year

## 2018-03-16 ENCOUNTER — Other Ambulatory Visit: Payer: Self-pay | Admitting: Internal Medicine

## 2018-09-14 ENCOUNTER — Emergency Department
Admission: EM | Admit: 2018-09-14 | Discharge: 2018-09-14 | Disposition: A | Discharge status: Home / Self Care | Payer: 59 | Attending: Emergency Medicine | Admitting: Emergency Medicine

## 2018-09-14 ENCOUNTER — Emergency Department: Payer: 59

## 2018-09-14 ENCOUNTER — Encounter: Payer: Self-pay | Admitting: Emergency Medicine

## 2018-09-14 ENCOUNTER — Other Ambulatory Visit: Payer: Self-pay

## 2018-09-14 DIAGNOSIS — Z79899 Other long term (current) drug therapy: Secondary | ICD-10-CM | POA: Insufficient documentation

## 2018-09-14 DIAGNOSIS — Z7982 Long term (current) use of aspirin: Secondary | ICD-10-CM | POA: Insufficient documentation

## 2018-09-14 DIAGNOSIS — R079 Chest pain, unspecified: Secondary | ICD-10-CM | POA: Diagnosis not present

## 2018-09-14 DIAGNOSIS — R410 Disorientation, unspecified: Secondary | ICD-10-CM

## 2018-09-14 DIAGNOSIS — R0602 Shortness of breath: Secondary | ICD-10-CM | POA: Diagnosis not present

## 2018-09-14 LAB — CBC
HCT: 43.3 % (ref 39.0–52.0)
Hemoglobin: 14.7 g/dL (ref 13.0–17.0)
MCH: 31.7 pg (ref 26.0–34.0)
MCHC: 33.9 g/dL (ref 30.0–36.0)
MCV: 93.3 fL (ref 80.0–100.0)
Platelets: 191 10*3/uL (ref 150–400)
RBC: 4.64 MIL/uL (ref 4.22–5.81)
RDW: 12.6 % (ref 11.5–15.5)
WBC: 5.9 10*3/uL (ref 4.0–10.5)
nRBC: 0 % (ref 0.0–0.2)

## 2018-09-14 LAB — BASIC METABOLIC PANEL
Anion gap: 8 (ref 5–15)
BUN: 22 mg/dL — ABNORMAL HIGH (ref 6–20)
CO2: 26 mmol/L (ref 22–32)
Calcium: 9.2 mg/dL (ref 8.9–10.3)
Chloride: 105 mmol/L (ref 98–111)
Creatinine, Ser: 0.99 mg/dL (ref 0.61–1.24)
GFR calc Af Amer: >60 mL/min (ref >60)
Glucose, Bld: 88 mg/dL (ref 70–99)
Potassium: 4.1 mmol/L (ref 3.5–5.1)
Sodium: 139 mmol/L (ref 135–145)

## 2018-09-14 LAB — TROPONIN I

## 2018-09-14 NOTE — ED Notes (Addendum)
Pt signed for d/c paperwork but Topaz froze after signing. Pt left without signing printed d/c paperwork.

## 2018-09-14 NOTE — ED Notes (Signed)
This EDT went into ED7 to draw troponin. Pt refused labs. Pt stated that this was not needed and that he was ready to go. DR and Rn notified.

## 2018-09-14 NOTE — ED Notes (Addendum)
Pt refuses to let this RN take blood for 2nd troponin.

## 2018-09-14 NOTE — ED Notes (Signed)
Pt looking around triage room aimlessly, staring off during triage.  Pt oriented x 4 at this time.  Presentation discussed with EDP, Schaevitz.  Pt to be roomed next.

## 2018-09-14 NOTE — ED Triage Notes (Addendum)
Pt in via POV, reports sudden onset dizziness while driving approximately one hour ago, reports left side chest pain followed w/ associated back pain, dizziness, shortness of breath.  Pt reports heart valve repair approximately 10 years ago, denies any further cardiac hx.  Vitals WDL at this time.

## 2018-09-14 NOTE — ED Provider Notes (Signed)
-----------------------------------------   4:30 PM on 09/14/2018 ----------------------------------------- Patient refusing blood draw for repeat troponin.  Reports that he feels better and does not wish to wait for additional testing.  He understands that this limits our ability to diagnose acute coronary syndromes that may be causing his symptoms, which could result in disability or death.  He has medical decision-making capacity and is refusing further care in the emergency department at this time.  To be discharged AGAINST MEDICAL ADVICE.   Sharman Cheek, MD 09/14/18 [phone]

## 2018-09-14 NOTE — ED Provider Notes (Addendum)
[facility] Emergency Department Provider Note  ____________________________________________   First MD Initiated Contact with Patient 09/14/18 1352     (approximate)  I have reviewed the triage vital signs and the nursing notes.   HISTORY  Chief Complaint Chest Pain   HPI Andrew Porter is a 58 y.o. male with a history of Lyme disease which is been treated in the past was presented emergency department with confusion, chest pain as well as back pain that started approximately an hour and a half prior to arrival.  The patient says that he has approximately had 20 episodes similar to this over years and years that he attributes to his Lyme disease.  Despite his Lyme disease being treated he says that he has had persistent episodes similar to this in the past.  Says that he was driving his car when he started to have confusion and difficulty talking.  He then developed left-sided crampy chest pain radiating through to his thoracic back.  He says the pain was approximately 6 out of 10 at the time but is a 4 out of 10 currently.  He says that he is also no longer confused and is able to speak normally.  Patient also with history of mitral valve repair.  Takes aspirin for this.   Past Medical History:  Diagnosis Date  . Lyme disease 2005  . Migraine with aura   . MVP (mitral valve prolapse)     Patient Active Problem List   Diagnosis Date Noted  . BPH (benign prostatic hyperplasia) 01/03/2018  . Periumbilical abdominal pain 06/28/2017  . Fatigue 06/06/2014  . Routine general medical examination at a health care facility 05/22/2012  . Sleep disturbance 05/22/2012  . Migraine with aura   . MALE PATTERN BALDNESS 10/08/2009    Past Surgical History:  Procedure Laterality Date  . CYSTECTOMY  12/09   arachnoid  . heart valve repair    . INGUINAL HERNIA REPAIR  2000   right ( [facility])  . US ECHOCARDIOGRAPHY  12/1990 & 10/2003   MVP/ mild MR normal  EF, moderate MR 10/2003    Prior to Admission medications   Medication Sig Start Date End Date Taking? Authorizing Provider  aspirin 81 MG tablet Take 81 mg by mouth daily.   Yes [provider]  Cyanocobalamin (B-12) 5000 MCG SUBL Place 1 tablet under the tongue daily.    Yes [provider]  finasteride (PROSCAR) 5 MG tablet Take 1 tablet (5 mg total) by mouth daily. 01/03/18  Yes Karie Schwalbe, MD  loratadine (CLARITIN) 10 MG tablet Take 10 mg by mouth daily.   Yes [provider]  Magnesium 250 MG TABS Take 250 mg by mouth daily.   Yes [provider]  Multiple Vitamin (MULTIVITAMIN) capsule Take 1 capsule by mouth daily.    Yes [provider]  Calcium-Magnesium-Vitamin D (CALCIUM MAGNESIUM PO) Take by mouth.    [provider]    Allergies Levofloxacin  Family History  Problem Relation Age of Onset  . Coronary artery disease Father   . Coronary artery disease Paternal Grandfather   . Diabetes Neg Hx   . Hypertension Neg Hx   . Cancer Neg Hx     Social History Social History   Tobacco Use  . Smoking status: Never Smoker  . Smokeless tobacco: Never Used  Substance Use Topics  . Alcohol use: No  . Drug use: No    Review of Systems  Constitutional:  No fever/chills Eyes: No visual changes. ENT: No sore throat. Cardiovascular: As above Respiratory: Denies shortness of breath. Gastrointestinal: No abdominal pain.  No nausea, no vomiting.  No diarrhea.  No constipation. Genitourinary: Negative for dysuria. Musculoskeletal: Negative for back pain. Skin: Negative for rash. Neurological: As above   ____________________________________________   PHYSICAL EXAM:  VITAL SIGNS: ED Triage Vitals  Enc Vitals Group     BP 09/14/18 1310 128/83     Pulse Rate 09/14/18 1310 67     Resp 09/14/18 1310 20     Temp 09/14/18 1310 98.4 F (36.9 C)     Temp Source 09/14/18 1310 Oral     SpO2 09/14/18 1310 98 %      Weight 09/14/18 1323 182 lb (82.6 kg)     Height 09/14/18 1323 5\' 11"  (1.803 m)     Head Circumference --      Peak Flow --      Pain Score 09/14/18 1323 5     Pain Loc --      Pain Edu? --      Excl. in GC? --     Constitutional: Alert and oriented. Well appearing and in no acute distress. Eyes: Conjunctivae are normal.  Head: Atraumatic. Nose: No congestion/rhinnorhea. Mouth/Throat: Mucous membranes are moist.  Neck: No stridor.   Cardiovascular: Normal rate, regular rhythm. Grossly normal heart sounds.  Good peripheral circulation with equal and bilateral dorsalis pedis as well as radial pulses.   Respiratory: Normal respiratory effort.  No retractions. Lungs CTAB. Gastrointestinal: Soft and nontender. No distention.  Musculoskeletal: No lower extremity tenderness nor edema.  No joint effusions.  Patient says that his back feels better when I push on the musculature of the thoracic back. Neurologic:  Normal speech and language. No gross focal neurologic deficits are appreciated. Skin:  Skin is warm, dry and intact. No rash noted. Psychiatric: Mood and affect are normal. Speech and behavior are normal.  ____________________________________________   LABS (all labs ordered are listed, but only abnormal results are displayed)  Labs Reviewed  BASIC METABOLIC PANEL - Abnormal; Notable for the following components:      Result Value   BUN 22 (*)    All other components within normal limits  CBC  TROPONIN I  TROPONIN I  B. BURGDORFI ANTIBODIES   ____________________________________________  EKG  ED ECG REPORT I, Arelia Longestavid M Amandalee Lacap, the attending physician, personally viewed and interpreted this ECG.   Date: 09/14/2018  EKG Time: 1316  Rate: 65  Rhythm: normal sinus rhythm  Axis: Normal  Intervals:none  ST&T Change: ST segment elevation in 2 3 and aVF with also minimal elevation in V5 and V6.  No reciprocal T wave inversions or depressions.  No significant change from  previous.  ED ECG REPORT I, Arelia Longestavid M Kymberlyn Eckford, the attending physician, personally viewed and interpreted this ECG.   Date: 09/14/2018  EKG Time: 1428  Rate: 60  Rhythm: normal sinus rhythm  Axis: Normal  Intervals:none  ST&T Change: Persistent ST elevation in 2 3 and aVF as well as V5 and V6 without any reciprocal T wave inversions or depressions.  No significant change from previous.   ____________________________________________  RADIOLOGY  Chest x-ray without acute process. ____________________________________________   PROCEDURES  Procedure(s) performed:   Procedures  Critical Care performed:   ____________________________________________   INITIAL IMPRESSION / ASSESSMENT AND PLAN / ED COURSE  Pertinent labs & imaging results that were available during my care of the patient were reviewed by  me and considered in my medical decision making (see chart for details).  Differential diagnosis includes, but is not limited to, ACS, aortic dissection, pulmonary embolism, cardiac tamponade, pneumothorax, pneumonia, pericarditis, myocarditis, GI-related causes including esophagitis/gastritis, and musculoskeletal chest wall pain.   As part of my medical decision making, I reviewed the following data within the electronic MEDICAL RECORD NUMBER Notes from prior ED visits  Discussed advanced imaging with the patient and he says that usually his episodes resolved and he would not like CAT scans at this time.  Believe this is reasonable given that he is a very reassuring neurologic exam.  2 EKGs without any progression to STEMI.  Reassuring lab results thus far.  Will have a repeat troponin.  Likely discharge home as long as symptoms resolve and labs continue to be reassuring.  Signed out to Dr. Scotty Court.  Equal pulses.  Normal chest x-ray.  Unlikely to be aortic catastrophe.  Also with normal heart rate in the 60s.  Did not report shortness of breath.  Unlikely to be  PE. ____________________________________________   FINAL CLINICAL IMPRESSION(S) / ED DIAGNOSES  Chest pain.  Confusion.  NEW MEDICATIONS STARTED DURING THIS VISIT:  New Prescriptions   No medications on file     Note:  This document was prepared using Dragon voice recognition software and may include unintentional dictation errors.     Myrna Blazer, MD 09/14/18 1523    Pershing Proud Myra Rude, MD 09/14/18 1524

## 2018-09-17 ENCOUNTER — Telehealth: Payer: Self-pay

## 2018-09-17 NOTE — Telephone Encounter (Signed)
Spoke to pt. He said he is doing pretty good.

## 2018-10-01 DIAGNOSIS — R0789 Other chest pain: Secondary | ICD-10-CM | POA: Diagnosis not present

## 2018-10-01 DIAGNOSIS — Z0000 Encounter for general adult medical examination without abnormal findings: Secondary | ICD-10-CM | POA: Diagnosis not present

## 2018-10-01 DIAGNOSIS — R0602 Shortness of breath: Secondary | ICD-10-CM | POA: Diagnosis not present

## 2018-10-01 DIAGNOSIS — Z9889 Other specified postprocedural states: Secondary | ICD-10-CM | POA: Diagnosis not present

## 2018-10-05 ENCOUNTER — Ambulatory Visit: Payer: Self-pay | Admitting: Internal Medicine

## 2018-10-05 NOTE — Telephone Encounter (Signed)
Tingling in arms and legs since Monday 10-01-2018.  Patient C/O severe pain when lifting his arms.  Patient does have pain in his back. Patient also C/O dizziness.  Happened x3 weeks ago and was seen in ED nothing was found cardiac wise.  F/U with Cardiology on Monday and they R/O Cardiac issues.  Went to Urgent Care today and they were not able to help him either. / Wife is wanting to know if patient can be checked out for other issues as it does not seem to be cardiac related. / No shortness of breath or trouble breathing, but does have some dizziness at times when he goes to stand. Patient is able to bear weight on legs. / Since patient has been seen by ED, Cardiology and urgent care, appointment was given for PCP as symptoms may be related to something else. / Denies any new medications, change in diet, or exercise activity.  Reason for Disposition . Numbness in a leg or foot (i.e., loss of sensation) . [1] Numbness in an arm or hand (i.e., loss of sensation) AND [2] upper back pain  Answer Assessment - Initial Assessment Questions 1. ONSET: "When did the pain begin?"      Started on 10-01-2018 2. LOCATION: "Where does it hurt?" (upper, mid or lower back)    Shoulder blade area 3. SEVERITY: "How bad is the pain?"  (e.g., Scale 1-10; mild, moderate, or severe) Severe 4. PATTERN: "Is the pain constant?" (e.g., yes, no; constant, intermittent)  Constant gets worse with lifting arms 5. RADIATION: "Does the pain shoot into your legs or elsewhere?"     no  7. BACK OVERUSE:  "Any recent lifting of heavy objects, strenuous work or exercise?"    no 8. MEDICATIONS: "What have you taken so far for the pain?" (e.g., nothing, acetaminophen, NSAIDS)   Aspirin 9. NEUROLOGIC SYMPTOMS: "Do you have any weakness, numbness, or problems with bowel/bladder control?"   Weakness in legs and arms 10. OTHER SYMPTOMS: "Do you have any other symptoms?" (e.g., fever, abdominal pain, burning with urination, blood  in urine)      no  Protocols used: BACK PAIN-A-AH

## 2018-10-08 ENCOUNTER — Ambulatory Visit: Payer: 59 | Admitting: Internal Medicine

## 2018-10-08 ENCOUNTER — Encounter: Payer: Self-pay | Admitting: Internal Medicine

## 2018-10-08 VITALS — BP 130/78 | HR 73 | Temp 97.8°F | Ht 70.25 in | Wt 189.0 lb

## 2018-10-08 DIAGNOSIS — M791 Myalgia, unspecified site: Secondary | ICD-10-CM | POA: Insufficient documentation

## 2018-10-08 NOTE — Progress Notes (Signed)
Subjective:    Patient ID: Andrew Porter, male    DOB: 08-Aug-1960, 58 y.o.   MRN: [id number]  HPI Here due to scattered pain issues  Has had resurgence of "some of the same stuff" and some new stuff Frustrated since he had been feeling better--and now has "gone backwards"  Symptoms started coming on ~5-6 weeks ago Finally went to ER---no clear diagnosis Pain was building up, then got sensation of "there ground rocking" Massive pain mid chest to back Numbness in left arm Hard to even get his words out right Went to ER then  Then went to cardiologist--to be sure everything okay Echo showed valve was fine  ;eft arm has been sore for a month Left thumb is sore--- "if I do anything with it" Notes pain inbetween his shoulder blades if he reaches up with either arm (right is worse)  Has tried tylenol, ibuprofen, aspirin--none particularly helpful  Current Outpatient Medications on File Prior to Visit  Medication Sig Dispense Refill  . aspirin 81 MG tablet Take 81 mg by mouth daily.    . Cyanocobalamin (B-12) 5000 MCG SUBL Place 1 tablet under the tongue daily.     . finasteride (PROSCAR) 5 MG tablet Take 1 tablet (5 mg total) by mouth daily. 90 tablet 3  . loratadine (CLARITIN) 10 MG tablet Take 10 mg by mouth daily.    . Magnesium 250 MG TABS Take 250 mg by mouth daily.    . Multiple Vitamin (MULTIVITAMIN) capsule Take 1 capsule by mouth daily.      No current facility-administered medications on file prior to visit.     Allergies  Allergen Reactions  . Levofloxacin Other (See Comments)    Nightmares,muscle aches     Past Medical History:  Diagnosis Date  . Lyme disease 2005  . Migraine with aura   . MVP (mitral valve prolapse)     Past Surgical History:  Procedure Laterality Date  . CYSTECTOMY  12/09   arachnoid  . heart valve repair    . INGUINAL HERNIA REPAIR  2000   right ( [facility])  . US ECHOCARDIOGRAPHY  12/1990 & 10/2003   MVP/ mild MR normal  EF, moderate MR 10/2003    Family History  Problem Relation Age of Onset  . Coronary artery disease Father   . Coronary artery disease Paternal Grandfather   . Diabetes Neg Hx   . Hypertension Neg Hx   . Cancer Neg Hx     Social History   Socioeconomic History  . Marital status: Married    Spouse name: Not on file  . Number of children: 3  . Years of education: Not on file  . Highest education level: Not on file  Occupational History  . Occupation: Civil engineer, [employer]  Social Needs  . Financial resource strain: Not on file  . Food insecurity:    Worry: Not on file    Inability: Not on file  . Transportation needs:    Medical: Not on file    Non-medical: Not on file  Tobacco Use  . Smoking status: Never Smoker  . Smokeless tobacco: Never Used  Substance and Sexual Activity  . Alcohol use: No  . Drug use: No  . Sexual activity: Not on file  Lifestyle  . Physical activity:    Days per week: Not on file    Minutes per session: Not on file  . Stress: Not on file  Relationships  . Social connections:  Talks on phone: Not on file    Gets together: Not on file    Attends religious service: Not on file    Active member of club or organization: Not on file    Attends meetings of clubs or organizations: Not on file    Relationship status: Not on file  . Intimate partner violence:    Fear of current or ex partner: Not on file    Emotionally abused: Not on file    Physically abused: Not on file    Forced sexual activity: Not on file  Other Topics Concern  . Not on file  Social History Narrative  . Not on file   Review of Systems No fever Doesn't feel sick but does feel "heavy" (like weak--even going up stairs) Stopped tennis a couple of weeks ago Sleeps fine--- not refreshed at night Appetite is okay Weight stable    Objective:   Physical Exam  Constitutional: He appears well-developed. No distress.  HENT:  Mouth/Throat: Oropharynx is  clear and moist. No oropharyngeal exudate.  No oral lesions  Neck: No thyromegaly present.  Cardiovascular: Normal rate, regular rhythm, normal heart sounds and intact distal pulses. Exam reveals no gallop.  No murmur heard. Respiratory: Effort normal and breath sounds normal. No respiratory distress. He has no wheezes. He has no rales.  GI: Soft. There is no abdominal tenderness.  Musculoskeletal:        General: No tenderness or edema.  Lymphadenopathy:    He has no cervical adenopathy.    He has no axillary adenopathy.       Right: No inguinal adenopathy present.       Left: No inguinal adenopathy present.  Skin: No rash noted. No erythema.  Psychiatric: He has a normal mood and affect. His behavior is normal.  Frustrated but not depressed           Assessment & Plan:

## 2018-10-08 NOTE — Assessment & Plan Note (Signed)
Vague  No synovitis, fever, rash Nothing to suggest endocarditis (he asked about this) Pattern and his age no consistent with PMR Somewhat similar to past symptoms ---possibly attributable to quinolone exposure--but different Has vague sense of ground moving--but not really vestibular (was a big part of his past problems) Will check ESR and CPK ?consider neurology reevaluation

## 2018-10-09 LAB — CK: Total CK: 98 U/L (ref 7–232)

## 2018-10-09 LAB — SEDIMENTATION RATE: Sed Rate: 3 mm/hr (ref 0–20)

## 2018-11-09 MED ORDER — SUCCINYLCHOLINE CHLORIDE 20 MG/ML IJ SOLN
INTRAMUSCULAR | Status: AC
  Filled 2018-11-09: qty 1

## 2018-11-09 MED ORDER — MIDAZOLAM HCL 2 MG/2ML IJ SOLN
INTRAMUSCULAR | Status: AC
  Filled 2018-11-09: qty 2

## 2018-11-09 MED ORDER — PROPOFOL 10 MG/ML IV BOLUS
INTRAVENOUS | Status: AC
  Filled 2018-11-09: qty 20

## 2018-11-09 MED ORDER — LIDOCAINE HCL (PF) 2 % IJ SOLN
INTRAMUSCULAR | Status: AC
  Filled 2018-11-09: qty 10

## 2018-11-09 MED ORDER — SEVOFLURANE IN SOLN
RESPIRATORY_TRACT | Status: AC
  Filled 2018-11-09: qty 250

## 2018-11-09 MED ORDER — DEXAMETHASONE SODIUM PHOSPHATE 10 MG/ML IJ SOLN
INTRAMUSCULAR | Status: AC
  Filled 2018-11-09: qty 1

## 2018-11-09 MED ORDER — ONDANSETRON HCL 4 MG/2ML IJ SOLN
INTRAMUSCULAR | Status: AC
  Filled 2018-11-09: qty 2

## 2018-11-09 MED ORDER — FENTANYL CITRATE (PF) 100 MCG/2ML IJ SOLN
INTRAMUSCULAR | Status: AC
  Filled 2018-11-09: qty 2

## 2018-11-09 MED ORDER — ROCURONIUM BROMIDE 50 MG/5ML IV SOLN
INTRAVENOUS | Status: AC
  Filled 2018-11-09: qty 1

## 2018-11-09 MED ORDER — PHENYLEPHRINE HCL (PRESSORS) 10 MG/ML IV SOLN
INTRAVENOUS | Status: AC
  Filled 2018-11-09: qty 1

## 2018-12-11 ENCOUNTER — Other Ambulatory Visit: Payer: Self-pay | Admitting: Internal Medicine

## 2019-01-07 ENCOUNTER — Encounter: Payer: 59 | Admitting: Internal Medicine

## 2019-03-13 ENCOUNTER — Other Ambulatory Visit: Payer: Self-pay | Admitting: Internal Medicine

## 2019-05-03 ENCOUNTER — Ambulatory Visit (INDEPENDENT_AMBULATORY_CARE_PROVIDER_SITE_OTHER): Payer: 59 | Admitting: Internal Medicine

## 2019-05-03 ENCOUNTER — Other Ambulatory Visit: Payer: Self-pay

## 2019-05-03 ENCOUNTER — Encounter: Payer: Self-pay | Admitting: Internal Medicine

## 2019-05-03 VITALS — BP 106/74 | HR 94 | Temp 98.3°F | Ht 70.5 in | Wt 176.0 lb

## 2019-05-03 DIAGNOSIS — N400 Enlarged prostate without lower urinary tract symptoms: Secondary | ICD-10-CM | POA: Diagnosis not present

## 2019-05-03 DIAGNOSIS — Z23 Encounter for immunization: Secondary | ICD-10-CM

## 2019-05-03 DIAGNOSIS — Z1211 Encounter for screening for malignant neoplasm of colon: Secondary | ICD-10-CM | POA: Diagnosis not present

## 2019-05-03 DIAGNOSIS — Z0000 Encounter for general adult medical examination without abnormal findings: Secondary | ICD-10-CM | POA: Diagnosis not present

## 2019-05-03 NOTE — Assessment & Plan Note (Signed)
Taking 1/2 tab daily

## 2019-05-03 NOTE — Progress Notes (Signed)
Subjective:    Patient ID: Andrew Porter, male    DOB: 1961/04/03, 58 y.o.   MRN: [id number]  HPI Here for physical  Doing well All the symptoms are improving---tingling, flashing in eyes Still has some lingering issues with exercise tolerance (feels he is not as good on the tennis court as his peers---will huff and puff easily)  He cut back on the finasteride---cutting them in half Some decrease in libido and had some ED---better on half dose Nocturia is gone now No problems with stream  Current Outpatient Medications on File Prior to Visit  Medication Sig Dispense Refill  . aspirin 81 MG tablet Take 81 mg by mouth daily.    . Cholecalciferol (DIALYVITE VITAMIN D 5000 PO) Take by mouth.    . Cyanocobalamin (B-12) 5000 MCG SUBL Place 1 tablet under the tongue daily.     . finasteride (PROSCAR) 5 MG tablet TAKE 1 TABLET BY MOUTH EVERY DAY (Patient taking differently: Take 2.5 mg by mouth daily. ) 90 tablet 0  . loratadine (CLARITIN) 10 MG tablet Take 10 mg by mouth daily.    . Magnesium 250 MG TABS Take 250 mg by mouth daily.    . Multiple Vitamin (MULTIVITAMIN) capsule Take 1 capsule by mouth daily.      No current facility-administered medications on file prior to visit.     Allergies  Allergen Reactions  . Levofloxacin Other (See Comments)    Nightmares,muscle aches     Past Medical History:  Diagnosis Date  . Lyme disease 2005  . Migraine with aura   . MVP (mitral valve prolapse)     Past Surgical History:  Procedure Laterality Date  . CYSTECTOMY  12/09   arachnoid  . heart valve repair    . INGUINAL HERNIA REPAIR  2000   right ( [facility])  . US ECHOCARDIOGRAPHY  12/1990 & 10/2003   MVP/ mild MR normal EF, moderate MR 10/2003    Family History  Problem Relation Age of Onset  . Coronary artery disease Father   . Coronary artery disease Paternal Grandfather   . Diabetes Neg Hx   . Hypertension Neg Hx   . Cancer Neg Hx     Social History    Socioeconomic History  . Marital status: Married    Spouse name: Not on file  . Number of children: 3  . Years of education: Not on file  . Highest education level: Not on file  Occupational History  . Occupation: Civil engineer, [employer]  Social Needs  . Financial resource strain: Not on file  . Food insecurity    Worry: Not on file    Inability: Not on file  . Transportation needs    Medical: Not on file    Non-medical: Not on file  Tobacco Use  . Smoking status: Never Smoker  . Smokeless tobacco: Never Used  Substance and Sexual Activity  . Alcohol use: No  . Drug use: No  . Sexual activity: Not on file  Lifestyle  . Physical activity    Days per week: Not on file    Minutes per session: Not on file  . Stress: Not on file  Relationships  . Social Musician on phone: Not on file    Gets together: Not on file    Attends religious service: Not on file    Active member of club or organization: Not on file    Attends meetings of clubs or  organizations: Not on file    Relationship status: Not on file  . Intimate partner violence    Fear of current or ex partner: Not on file    Emotionally abused: Not on file    Physically abused: Not on file    Forced sexual activity: Not on file  Other Topics Concern  . Not on file  Social History Narrative  . Not on file   Review of Systems  Constitutional: Negative for fatigue.       Weight is down some Eats one meal a day Wears seat belt  HENT: Negative for dental problem, hearing loss, tinnitus and trouble swallowing.        Keeps up with dentist  Eyes: Positive for visual disturbance.       Prescription changed dramatically No unilateral vision loss lately  Respiratory: Negative for cough, chest tightness and shortness of breath.   Cardiovascular: Negative for chest pain, palpitations and leg swelling.  Gastrointestinal: Negative for blood in stool and constipation.       No heartburn    Endocrine: Negative for polydipsia and polyuria.  Genitourinary: Negative for difficulty urinating and urgency.  Musculoskeletal: Positive for arthralgias. Negative for back pain and joint swelling.       Still has generalized joint pains Hasn't found OTC analgesics helpful  Skin: Negative for rash.       No suspicious lesions  Allergic/Immunologic: Positive for environmental allergies. Negative for immunocompromised state.       Loratadine works fine  Neurological: Negative for dizziness, syncope, light-headedness and headaches.  Hematological: Negative for adenopathy. Does not bruise/bleed easily.  Psychiatric/Behavioral: Negative for dysphoric mood and sleep disturbance. The patient is not nervous/anxious.        Objective:   Physical Exam  Constitutional: He is oriented to person, place, and time. He appears well-developed. No distress.  HENT:  Head: Normocephalic and atraumatic.  Right Ear: External ear normal.  Left Ear: External ear normal.  Mouth/Throat: Oropharynx is clear and moist. No oropharyngeal exudate.  Eyes: Pupils are equal, round, and reactive to light. Conjunctivae are normal.  Neck: No thyromegaly present.  Cardiovascular: Normal rate, regular rhythm, normal heart sounds and intact distal pulses. Exam reveals no gallop.  No murmur heard. Respiratory: Effort normal and breath sounds normal. No respiratory distress. He has no wheezes. He has no rales.  GI: Soft. There is no abdominal tenderness.  Musculoskeletal:        General: No tenderness or edema.  Lymphadenopathy:    He has no cervical adenopathy.  Neurological: He is alert and oriented to person, place, and time.  Skin: No rash noted. No erythema.  Psychiatric: He has a normal mood and affect. His behavior is normal.           Assessment & Plan:

## 2019-05-03 NOTE — Addendum Note (Signed)
Addended by: Pilar Grammes on: 05/03/2019 12:26 PM   Modules accepted: Orders

## 2019-05-03 NOTE — Assessment & Plan Note (Signed)
Healthy Still some residual symptoms but the myalgia, visual symptoms, headaches are mostly better Will do FIT Will defer PSA after discussion FLu vaccine Stays fit

## 2019-06-11 ENCOUNTER — Other Ambulatory Visit: Payer: Self-pay | Admitting: Internal Medicine

## 2019-09-06 ENCOUNTER — Telehealth: Payer: Self-pay | Admitting: Radiology

## 2019-09-06 NOTE — Telephone Encounter (Signed)
Due to the pandemic mail was held up and several IFOB kits were received in the [facility] lab on approximately January 20th 2021 dating back as far as Septometer 2020. New IFOB kits have been mailed as of September 05, 2019 to all patients affected along with written communication as to why they received another IFOB and that they were not charges for the prior test.   

## 2020-03-09 ENCOUNTER — Telehealth: Payer: Self-pay

## 2020-03-09 NOTE — Telephone Encounter (Signed)
Pt notified as instructed and voiced understanding; pt said he is over 2 hours away from [place] and appreciates Dr Alphonsus Sias being willing to work him in but he cannot get to office by 4:15 and Pt said had explosive diarrhea x 1 and pt said rectal bleed on and off and last time saw the blood was last week. Pt said in last hour has seen progress, pt will wait until tomorrow and see how he does. FYI to Dr Alphonsus Sias.

## 2020-03-09 NOTE — Telephone Encounter (Signed)
Left message to call office. If he has not been seen anywhere, we can add him on at [phone]. Asked him to call me back as soon as he can.

## 2020-03-09 NOTE — Telephone Encounter (Signed)
Okay---glad to hear he seems to be making progress

## 2020-03-09 NOTE — Telephone Encounter (Signed)
[facility] Day - Client TELEPHONE ADVICE RECORD [facility] Patient Name: Andrew Porter AI Gender: Male DOB: 28-Feb-1961 Age: 59 Y 1 M 26 D Return Phone Number: [phone] (Primary), [phone] (Secondary) Address: City/State/Zip: [place] [address] Client [facility] Day - Client Client Site [facility] - Day Physician Tillman Abide- MD Contact Type Call Who Is Calling Patient / Member / Family / Caregiver Call Type Triage / Clinical Relationship To Patient Self Return Phone Number [phone] (Primary) Chief Complaint Rectal Bleeding Reason for Call Symptomatic / Request for Health Information Initial Comment Caller states that he has had explosive diarrhea, constipation at time, and rectal bleeding for the last 2 weeks. Caller said he woke up friday morning and had explosive diarrhea and started sweating and felt like he was going to pass out. Caller said it has since turned into constipation. Translation No Nurse Assessment Nurse: Vear Clock, RN, Elease Hashimoto Date/Time [name] Time): 03/09/2020 10:30:41 AM Confirm and document reason for call. If symptomatic, describe symptoms. ---He has had explosive diarrhea and rectal bleeding for the last 2 weeks. He woke up Friday morning and had explosive diarrhea , and had severe stomach pain but went away after her the stool and He had sweating and felt like he was going to pass out. Caller said it has since turned into constipation. He is now having issues going to the [facility]. Has the patient had close contact with a person known or suspected to have the novel coronavirus illness OR traveled / lives in area with major community spread (including international travel) in the last 14 days from the onset of symptoms? * If Asymptomatic, screen for exposure and travel within the last 14 days. ---No Does the patient have any new or worsening symptoms? ---Yes Will  a triage be completed? ---Yes Related visit to physician within the last 2 weeks? ---No Does the PT have any chronic conditions? (i.e. diabetes, asthma, this includes High risk factors for pregnancy, etc.) ---No Is this a behavioral health or substance abuse call? ---No PLEASE NOTE: All timestamps contained within this report are represented as [place] Standard Time. CONFIDENTIALTY NOTICE: This fax transmission is intended only for the addressee. It contains information that is legally privileged, confidential or otherwise protected from use or disclosure. If you are not the intended recipient, you are strictly prohibited from reviewing, disclosing, copying using or disseminating any of this information or taking any action in reliance on or regarding this information. If you have received this fax in error, please notify us immediately by telephone so that we can arrange for its return to [place]. Phone: [phone], Toll-Free: [phone], Fax: [phone] Page: 2 of 2 Call Id: [id number] Guidelines Guideline Title Affirmed Question Affirmed Notes Nurse Date/Time [name] Time) Constipation [1] Constant abdominal pain AND [2] present > 2 hours [name] 03/09/2020 10:34:01 AM Disp. Time [name] Time) Disposition Final User 03/09/2020 10:40:55 AM See HCP within 4 Hours (or PCP triage) Yes Vear Clock, RN, Ancil Boozer Disagree/Comply Comply Caller Understands Yes PreDisposition Call Doctor Care Advice Given Per Guideline SEE HCP WITHIN 4 HOURS (OR PCP TRIAGE): NOTHING BY MOUTH: CALL BACK IF: * You become worse. CARE ADVICE given per Constipation (Adult) guideline. Referrals Warm transfer to backline

## 2020-03-09 NOTE — Telephone Encounter (Signed)
Someone should have checked with me----I could have added him on before 2PM and still could add him on at 4:15 if he can come in then

## 2020-03-09 NOTE — Telephone Encounter (Signed)
Received call from Access Nurse that pt called c/o explosive diarrhea, rectal bleeding, constipation, and abdominal pain, x 2 wks. Call was transferred to this office because their protocol reported schedule pt for apt within 4 hours and no apt were available.  Talked to pt who reports on 8/6 he had explosive diarrhea for 2 hours and "pain that was unreal" with sweating and he nearly passed out. No diarrhea since then. He reports constipation since then and today has bloating, nausea, and abdominal pain and tenderness with touch. Pt also reports rectal bleeding x 2 wks when he wipes. Pt does not have any hx of hemorrhoids. Pt is concerned about colon cancer because he has not had colonoscopy. . Advised pt no apts are available and he should go to the ER due to abdominal pain and tenderness. Pt refused to go to the ER. Advised to go to an UC. Pt said he would go to an UC today. Advised a msg would be sent to PCP and if he had any problems to contact this office. Pt verbalized understanding.

## 2020-05-04 ENCOUNTER — Encounter: Payer: 59 | Admitting: Internal Medicine

## 2020-05-04 ENCOUNTER — Ambulatory Visit (INDEPENDENT_AMBULATORY_CARE_PROVIDER_SITE_OTHER): Payer: 59 | Admitting: Internal Medicine

## 2020-05-04 ENCOUNTER — Other Ambulatory Visit: Payer: Self-pay

## 2020-05-04 ENCOUNTER — Encounter: Payer: Self-pay | Admitting: Internal Medicine

## 2020-05-04 VITALS — BP 110/82 | HR 84 | Temp 98.1°F | Ht 70.0 in | Wt 173.0 lb

## 2020-05-04 DIAGNOSIS — Z23 Encounter for immunization: Secondary | ICD-10-CM | POA: Diagnosis not present

## 2020-05-04 DIAGNOSIS — L659 Nonscarring hair loss, unspecified: Secondary | ICD-10-CM | POA: Diagnosis not present

## 2020-05-04 DIAGNOSIS — Z0000 Encounter for general adult medical examination without abnormal findings: Secondary | ICD-10-CM | POA: Diagnosis not present

## 2020-05-04 DIAGNOSIS — G43109 Migraine with aura, not intractable, without status migrainosus: Secondary | ICD-10-CM

## 2020-05-04 DIAGNOSIS — Z1211 Encounter for screening for malignant neoplasm of colon: Secondary | ICD-10-CM

## 2020-05-04 NOTE — Assessment & Plan Note (Signed)
Doing well on low dose finasteride

## 2020-05-04 NOTE — Assessment & Plan Note (Signed)
Better lately 

## 2020-05-04 NOTE — Assessment & Plan Note (Signed)
Healthy Will set up colonoscopy----had rectal bleeding that resolved Will check PSA Flu vaccine today

## 2020-05-04 NOTE — Progress Notes (Signed)
Subjective:    Patient ID: Andrew Porter, male    DOB: 11-Jul-1961, 59 y.o.   MRN: [id number]  HPI Here for physical This visit occurred during the SARS-CoV-2 public health emergency.  Safety protocols were in place, including screening questions prior to the visit, additional usage of staff PPE, and extensive cleaning of exam room while observing appropriate contact time as indicated for disinfecting solutions.   Had some bright red rectal bleeding--started about 2 months ago No clear external source Intermittent and he was monitoring Then got "digestive episode"---with rectal urgency, diarrhea/sweats, near syncope Did have some blood then but not more No more   Tennis elbow on right ---2.5 months Has had to stop playing Had stopped using strap  Still on 1/2 finasteride---- 2 days out of 3 Satisfied with effect on hair No sexual problems  Current Outpatient Medications on File Prior to Visit  Medication Sig Dispense Refill  . aspirin 81 MG tablet Take 81 mg by mouth daily.    . Cholecalciferol (DIALYVITE VITAMIN D 5000 PO) Take by mouth.    . Cyanocobalamin (B-12) 5000 MCG SUBL Place 1 tablet under the tongue daily.     . finasteride (PROSCAR) 5 MG tablet TAKE 1 TABLET BY MOUTH EVERY DAY 90 tablet 3  . loratadine (CLARITIN) 10 MG tablet Take 10 mg by mouth daily.    . Magnesium 250 MG TABS Take 250 mg by mouth daily.    . Multiple Vitamin (MULTIVITAMIN) capsule Take 1 capsule by mouth daily.      No current facility-administered medications on file prior to visit.    Allergies  Allergen Reactions  . Levofloxacin Other (See Comments)    Nightmares,muscle aches     Past Medical History:  Diagnosis Date  . Lyme disease 2005  . Migraine with aura   . MVP (mitral valve prolapse)     Past Surgical History:  Procedure Laterality Date  . CYSTECTOMY  12/09   arachnoid  . heart valve repair    . INGUINAL HERNIA REPAIR  2000   right ( [facility])  . US  ECHOCARDIOGRAPHY  12/1990 & 10/2003   MVP/ mild MR normal EF, moderate MR 10/2003    Family History  Problem Relation Age of Onset  . Coronary artery disease Father   . Coronary artery disease Paternal Grandfather   . Diabetes Neg Hx   . Hypertension Neg Hx   . Cancer Neg Hx     Social History   Socioeconomic History  . Marital status: Married    Spouse name: Not on file  . Number of children: 3  . Years of education: Not on file  . Highest education level: Not on file  Occupational History  . Occupation: Civil engineer, [employer]  Tobacco Use  . Smoking status: Never Smoker  . Smokeless tobacco: Never Used  Vaping Use  . Vaping Use: Never used  Substance and Sexual Activity  . Alcohol use: No  . Drug use: No  . Sexual activity: Not on file  Other Topics Concern  . Not on file  Social History Narrative  . Not on file   Social Determinants of Health   Financial Resource Strain:   . Difficulty of Paying Living Expenses: Not on file  Food Insecurity:   . Worried About Programme researcher, broadcasting/film/video in the Last Year: Not on file  . Ran Out of Food in the Last Year: Not on file  Transportation Needs:   .  Lack of Transportation (Medical): Not on file  . Lack of Transportation (Non-Medical): Not on file  Physical Activity:   . Days of Exercise per Week: Not on file  . Minutes of Exercise per Session: Not on file  Stress:   . Feeling of Stress : Not on file  Social Connections:   . Frequency of Communication with Friends and Family: Not on file  . Frequency of Social Gatherings with Friends and Family: Not on file  . Attends Religious Services: Not on file  . Active Member of Clubs or Organizations: Not on file  . Attends Banker Meetings: Not on file  . Marital Status: Not on file  Intimate Partner Violence:   . Fear of Current or Ex-Partner: Not on file  . Emotionally Abused: Not on file  . Physically Abused: Not on file  . Sexually Abused: Not  on file   Review of Systems  Constitutional: Negative for fatigue.       Weight down on diet  Small first meal then moderate supper Lost 10#  HENT: Negative for dental problem, hearing loss and tinnitus.   Eyes: Positive for visual disturbance.       His spells are down to just once every 2 months or so  Respiratory: Negative for cough, chest tightness and shortness of breath.   Cardiovascular: Negative for chest pain, palpitations and leg swelling.  Gastrointestinal: Positive for blood in stool.       No heartburn   Endocrine: Negative for polydipsia and polyuria.  Genitourinary: Negative for difficulty urinating and urgency.  Musculoskeletal: Positive for arthralgias and back pain. Negative for joint swelling.       Joint pains are better---still chronic but less noticeable Will take 3 aspirin daily----- most days  Allergic/Immunologic: Positive for environmental allergies. Negative for immunocompromised state.       Claritin daily  Neurological: Negative for dizziness, syncope and light-headedness.       Headaches are rare now  Hematological: Negative for adenopathy. Does not bruise/bleed easily.  Psychiatric/Behavioral: Negative for dysphoric mood and sleep disturbance. The patient is not nervous/anxious.        Objective:   Physical Exam Constitutional:      Appearance: Normal appearance.  HENT:     Right Ear: Tympanic membrane, ear canal and external ear normal.     Left Ear: Tympanic membrane, ear canal and external ear normal.     Mouth/Throat:     Pharynx: No oropharyngeal exudate or posterior oropharyngeal erythema.  Eyes:     Conjunctiva/sclera: Conjunctivae normal.     Pupils: Pupils are equal, round, and reactive to light.  Cardiovascular:     Rate and Rhythm: Normal rate and regular rhythm.     Pulses: Normal pulses.     Heart sounds: No murmur heard.  No gallop.   Pulmonary:     Effort: Pulmonary effort is normal.     Breath sounds: Normal breath sounds.  No wheezing or rales.  Abdominal:     Palpations: Abdomen is soft.     Tenderness: There is no abdominal tenderness.  Musculoskeletal:     Cervical back: Neck supple.     Right lower leg: No edema.     Left lower leg: No edema.  Lymphadenopathy:     Cervical: No cervical adenopathy.  Skin:    General: Skin is warm.     Findings: No rash.  Neurological:     General: No focal deficit present.  Mental Status: He is alert and oriented to person, place, and time.  Psychiatric:        Mood and Affect: Mood normal.        Behavior: Behavior normal.            Assessment & Plan:

## 2020-05-05 LAB — COMPREHENSIVE METABOLIC PANEL
ALT: 11 U/L (ref 0–53)
AST: 16 U/L (ref 0–37)
Albumin: 4.3 g/dL (ref 3.5–5.2)
Alkaline Phosphatase: 46 U/L (ref 39–117)
BUN: 18 mg/dL (ref 6–23)
CO2: 31 mEq/L (ref 19–32)
Calcium: 9.4 mg/dL (ref 8.4–10.5)
Chloride: 102 mEq/L (ref 96–112)
Creatinine, Ser: 1.19 mg/dL (ref 0.40–1.50)
GFR: 66.78 mL/min (ref >60.00)
Glucose, Bld: 70 mg/dL (ref 70–99)
Potassium: 4.4 mEq/L (ref 3.5–5.1)
Sodium: 141 mEq/L (ref 135–145)
Total Bilirubin: 0.5 mg/dL (ref 0.2–1.2)
Total Protein: 7 g/dL (ref 6.0–8.3)

## 2020-05-05 LAB — CBC
HCT: 44.6 % (ref 39.0–52.0)
Hemoglobin: 14.9 g/dL (ref 13.0–17.0)
MCHC: 33.4 g/dL (ref 30.0–36.0)
MCV: 97.7 fl (ref 78.0–100.0)
Platelets: 187 10*3/uL (ref 150.0–400.0)
RBC: 4.56 Mil/uL (ref 4.22–5.81)
RDW: 13.3 % (ref 11.5–15.5)
WBC: 6.5 10*3/uL (ref 4.0–10.5)

## 2020-05-05 LAB — PSA: PSA: 1.18 ng/mL (ref 0.10–4.00)

## 2020-06-01 LAB — HM COLONOSCOPY

## 2021-04-09 ENCOUNTER — Encounter: Payer: Self-pay | Admitting: Emergency Medicine

## 2021-04-09 ENCOUNTER — Other Ambulatory Visit: Payer: Self-pay

## 2021-04-09 ENCOUNTER — Emergency Department: Payer: 59

## 2021-04-09 DIAGNOSIS — K8020 Calculus of gallbladder without cholecystitis without obstruction: Secondary | ICD-10-CM | POA: Insufficient documentation

## 2021-04-09 DIAGNOSIS — Z7982 Long term (current) use of aspirin: Secondary | ICD-10-CM | POA: Diagnosis not present

## 2021-04-09 DIAGNOSIS — R202 Paresthesia of skin: Secondary | ICD-10-CM | POA: Diagnosis not present

## 2021-04-09 DIAGNOSIS — R0602 Shortness of breath: Secondary | ICD-10-CM | POA: Insufficient documentation

## 2021-04-09 DIAGNOSIS — R350 Frequency of micturition: Secondary | ICD-10-CM | POA: Diagnosis not present

## 2021-04-09 DIAGNOSIS — Z20822 Contact with and (suspected) exposure to covid-19: Secondary | ICD-10-CM | POA: Insufficient documentation

## 2021-04-09 DIAGNOSIS — M546 Pain in thoracic spine: Secondary | ICD-10-CM | POA: Diagnosis present

## 2021-04-09 DIAGNOSIS — R059 Cough, unspecified: Secondary | ICD-10-CM | POA: Diagnosis not present

## 2021-04-09 LAB — CBC
HCT: 44.7 % (ref 39.0–52.0)
Hemoglobin: 15.5 g/dL (ref 13.0–17.0)
MCH: 32.8 pg (ref 26.0–34.0)
MCHC: 34.7 g/dL (ref 30.0–36.0)
MCV: 94.7 fL (ref 80.0–100.0)
Platelets: 194 10*3/uL (ref 150–400)
RBC: 4.72 MIL/uL (ref 4.22–5.81)
RDW: 13.2 % (ref 11.5–15.5)
WBC: 8.6 10*3/uL (ref 4.0–10.5)
nRBC: 0 % (ref 0.0–0.2)

## 2021-04-09 LAB — TROPONIN I (HIGH SENSITIVITY): Troponin I (High Sensitivity): 6 ng/L (ref <18)

## 2021-04-09 LAB — BASIC METABOLIC PANEL
Anion gap: 9 (ref 5–15)
BUN: 24 mg/dL — ABNORMAL HIGH (ref 6–20)
CO2: 28 mmol/L (ref 22–32)
Calcium: 9.3 mg/dL (ref 8.9–10.3)
Chloride: 102 mmol/L (ref 98–111)
Creatinine, Ser: 1.1 mg/dL (ref 0.61–1.24)
GFR, Estimated: >60 mL/min (ref >60)
Glucose, Bld: 125 mg/dL — ABNORMAL HIGH (ref 70–99)
Potassium: 3.6 mmol/L (ref 3.5–5.1)
Sodium: 139 mmol/L (ref 135–145)

## 2021-04-09 NOTE — ED Triage Notes (Signed)
Pt reports back pain since yesterday pain started from mid back left shoulder blade radiating to lower back, reports had several episodes of emesis. Reports pain has decreased some in intensity but continues to have it. Reports hoarseness of voice. Pt reports his wife is concerned he may be having some symptoms of AAA. Pt ambulatory to triage with steady gait. Denies any injuries to his back, no strenuous activities. Pt talks in complete sentences no respiratory distress noted

## 2021-04-10 ENCOUNTER — Emergency Department: Payer: 59

## 2021-04-10 ENCOUNTER — Emergency Department
Admission: EM | Admit: 2021-04-10 | Discharge: 2021-04-10 | Disposition: A | Discharge status: Home / Self Care | Payer: 59 | Attending: Emergency Medicine | Admitting: Emergency Medicine

## 2021-04-10 DIAGNOSIS — M546 Pain in thoracic spine: Secondary | ICD-10-CM

## 2021-04-10 DIAGNOSIS — K8020 Calculus of gallbladder without cholecystitis without obstruction: Secondary | ICD-10-CM

## 2021-04-10 DIAGNOSIS — M549 Dorsalgia, unspecified: Secondary | ICD-10-CM

## 2021-04-10 LAB — HEPATIC FUNCTION PANEL
ALT: 136 U/L — ABNORMAL HIGH (ref 0–44)
AST: 63 U/L — ABNORMAL HIGH (ref 15–41)
Albumin: 3.9 g/dL (ref 3.5–5.0)
Alkaline Phosphatase: 57 U/L (ref 38–126)
Bilirubin, Direct: <0.1 mg/dL (ref 0.0–0.2)
Total Bilirubin: 0.4 mg/dL (ref 0.3–1.2)
Total Protein: 7 g/dL (ref 6.5–8.1)

## 2021-04-10 LAB — RESP PANEL BY RT-PCR (FLU A&B, COVID) ARPGX2
Influenza A by PCR: NEGATIVE
Influenza B by PCR: NEGATIVE
SARS Coronavirus 2 by RT PCR: NEGATIVE

## 2021-04-10 LAB — URINALYSIS, COMPLETE (UACMP) WITH MICROSCOPIC
Bacteria, UA: NONE SEEN
Bilirubin Urine: NEGATIVE
Glucose, UA: NEGATIVE mg/dL
Ketones, ur: NEGATIVE mg/dL
Leukocytes,Ua: NEGATIVE
Nitrite: NEGATIVE
Protein, ur: NEGATIVE mg/dL
Specific Gravity, Urine: 1.01 (ref 1.005–1.030)
Squamous Epithelial / HPF: NONE SEEN (ref 0–5)
pH: 6 (ref 5.0–8.0)

## 2021-04-10 LAB — LIPASE, BLOOD: Lipase: 31 U/L (ref 11–51)

## 2021-04-10 LAB — TROPONIN I (HIGH SENSITIVITY): Troponin I (High Sensitivity): 5 ng/L (ref <18)

## 2021-04-10 LAB — MAGNESIUM: Magnesium: 2.3 mg/dL (ref 1.7–2.4)

## 2021-04-10 MED ORDER — SODIUM CHLORIDE 0.9 % IV BOLUS (SEPSIS)
1000.0000 mL | Freq: Once | INTRAVENOUS | Status: AC
  Administered 2021-04-10: 1000 mL via INTRAVENOUS

## 2021-04-10 MED ORDER — HYDROCODONE-ACETAMINOPHEN 5-325 MG PO TABS: 2.0000 | ORAL_TABLET | ORAL | 0 refills | Status: DC | PRN

## 2021-04-10 MED ORDER — ONDANSETRON 4 MG PO TBDP
4.0000 mg | ORAL_TABLET | Freq: Four times a day (QID) | ORAL | 0 refills | Status: DC | PRN
Start: 2021-04-10 — End: 2021-04-30

## 2021-04-10 MED ORDER — IOHEXOL 350 MG/ML SOLN
100.0000 mL | Freq: Once | INTRAVENOUS | Status: AC | PRN
  Administered 2021-04-10: 100 mL via INTRAVENOUS

## 2021-04-10 NOTE — ED Notes (Signed)
Pt encouraged to provide urine sample. Unhooked from monitors, ambulates to toilet in room with steady gait.

## 2021-04-10 NOTE — ED Provider Notes (Signed)
[facility] Emergency Department Provider Note  ____________________________________________   Event Date/Time   First MD Initiated Contact with Patient 04/10/21 0132     (approximate)  I have reviewed the triage vital signs and the nursing notes.   HISTORY  Chief Complaint Back Pain and Shortness of Breath    HPI Andrew Porter is a 60 y.o. male with history of mitral valve prolapse status postrepair, migraines, Lyme disease who presents to the emergency department with complaints of sudden onset severe mid back pain that started yesterday while at rest.  Denies any known injury to the back.  It is worse with palpation and movement.  States he started having tingling over his entire body but no numbness, weakness.  No bowel or bladder incontinence.  No urinary retention.  States he also felt "red hot all over" and then would be "freezing cold".  States he is also had a "hoarseness in my throat".  He has had nausea and vomiting but no diarrhea.  Also reports urinary frequency without dysuria or hematuria.  Has had some dry cough and shortness of breath.  No chest pain or chest discomfort.  States the pain in his back has improved but not completely gone.  Did take a COVID test at home which was negative.  States he has never had anything like this before.  States his wife started to google his symptoms and they were concerned about an aortic dissection.        Past Medical History:  Diagnosis Date   Lyme disease 2005   Migraine with aura    MVP (mitral valve prolapse)     Patient Active Problem List   Diagnosis Date Noted   BPH (benign prostatic hyperplasia) 01/03/2018   Routine general medical examination at a health care facility 05/22/2012   Migraine with aura    Alopecia 10/08/2009    Past Surgical History:  Procedure Laterality Date   CYSTECTOMY  12/09   arachnoid   heart valve repair     INGUINAL HERNIA REPAIR  2000   right (  [place])   US ECHOCARDIOGRAPHY  12/1990 & 10/2003   MVP/ mild MR normal EF, moderate MR 10/2003    Prior to Admission medications   Medication Sig Start Date End Date Taking? Authorizing Provider  HYDROcodone-acetaminophen (NORCO/VICODIN) 5-325 MG tablet Take 2 tablets by mouth every 4 (four) hours as needed. 04/10/21  Yes Monie Shere N, DO  ondansetron (ZOFRAN ODT) 4 MG disintegrating tablet Take 1 tablet (4 mg total) by mouth every 6 (six) hours as needed for nausea or vomiting. 04/10/21  Yes Prabhjot Piscitello, Layla MawKristen N, DO  aspirin 81 MG tablet Take 81 mg by mouth daily.    [provider]  Cholecalciferol (DIALYVITE VITAMIN D 5000 PO) Take by mouth.    [provider]  Cyanocobalamin (B-12) 5000 MCG SUBL Place 1 tablet under the tongue daily.     [provider]  finasteride (PROSCAR) 5 MG tablet TAKE 1 TABLET BY MOUTH EVERY DAY 06/11/19   Karie SchwalbeLetvak, Richard I, MD  loratadine (CLARITIN) 10 MG tablet Take 10 mg by mouth daily.    [provider]  Magnesium 250 MG TABS Take 250 mg by mouth daily.    [provider]  Multiple Vitamin (MULTIVITAMIN) capsule Take 1 capsule by mouth daily.     [provider]    Allergies Levofloxacin  Family History  Problem Relation Age of Onset   Coronary artery disease Father  Coronary artery disease Paternal Grandfather    Diabetes Neg Hx    Hypertension Neg Hx    Cancer Neg Hx     Social History Social History   Tobacco Use   Smoking status: Never   Smokeless tobacco: Never  Vaping Use   Vaping Use: Never used  Substance Use Topics   Alcohol use: No   Drug use: No    Review of Systems Constitutional: No fever. Eyes: No visual changes. ENT: No sore throat. Cardiovascular: Denies chest pain. Respiratory: +shortness of breath. Gastrointestinal: +nausea, vomiting.  no diarrhea. Genitourinary: Negative for dysuria. Musculoskeletal: Negative for back pain. Skin: Negative for  rash. Neurological: Negative for focal weakness or numbness.  ____________________________________________   PHYSICAL EXAM:  VITAL SIGNS: ED Triage Vitals  Enc Vitals Group     BP 04/09/21 2240 126/84     Pulse Rate 04/09/21 2240 82     Resp 04/09/21 2240 16     Temp 04/09/21 2240 98.4 F (36.9 C)     Temp Source 04/09/21 2240 Oral     SpO2 04/09/21 2240 97 %     Weight 04/09/21 2242 175 lb (79.4 kg)     Height 04/09/21 2242  (1.778 m)     Head Circumference --      Peak Flow --      Pain Score 04/09/21 2241 6     Pain Loc --      Pain Edu? --      Excl. in GC? --    CONSTITUTIONAL: Alert and oriented and responds appropriately to questions. Well-appearing; well-nourished, appears uncomfortable, afebrile, nontoxic HEAD: Normocephalic EYES: Conjunctivae clear, pupils appear equal, EOM appear intact ENT: normal nose; moist mucous membranes NECK: Supple, normal ROM CARD: RRR; S1 and S2 appreciated; no murmurs, no clicks, no rubs, no gallops RESP: Normal chest excursion without splinting or tachypnea; breath sounds clear and equal bilaterally; no wheezes, no rhonchi, no rales, no hypoxia or respiratory distress, speaking full sentences ABD/GI: Normal bowel sounds; non-distended; soft, non-tender, no rebound, no guarding, no peritoneal signs, no hepatosplenomegaly BACK: The back appears normal, diffusely tender throughout the thoracic paraspinal muscles without midline step-off or deformity.  There is no soft tissue swelling, ecchymosis, rash or other lesions, redness or warmth. EXT: Normal ROM in all joints; no deformity noted, no edema; no cyanosis, no calf tenderness or calf swelling, extremities are warm and well-perfused SKIN: Normal color for age and race; warm; no rash on exposed skin NEURO: Moves all extremities equally, normal gait, normal sensation diffusely, no saddle anesthesia, normal speech, no facial asymmetry PSYCH: The patient's mood and manner are  appropriate.  ____________________________________________   LABS (all labs ordered are listed, but only abnormal results are displayed)  Labs Reviewed  BASIC METABOLIC PANEL - Abnormal; Notable for the following components:      Result Value   Glucose, Bld 125 (*)    BUN 24 (*)    All other components within normal limits  HEPATIC FUNCTION PANEL - Abnormal; Notable for the following components:   AST 63 (*)    ALT 136 (*)    All other components within normal limits  URINALYSIS, COMPLETE (UACMP) WITH MICROSCOPIC - Abnormal; Notable for the following components:   Hgb urine dipstick TRACE (*)    All other components within normal limits  RESP PANEL BY RT-PCR (FLU A&B, COVID) ARPGX2  CBC  LIPASE, BLOOD  MAGNESIUM  TROPONIN I (HIGH SENSITIVITY)  TROPONIN I (HIGH SENSITIVITY)  ____________________________________________  EKG   EKG Interpretation  Date/Time:  Friday April 09 2021 22:45:12 EDT Ventricular Rate:  79 PR Interval:  138 QRS Duration: 86 QT Interval:  372 QTC Calculation: 426 R Axis:   73 Text Interpretation: Normal sinus rhythm Normal ECG Confirmed by Rochele Raring [phone]) on 04/10/2021 1:34:19 AM        ____________________________________________  RADIOLOGY Normajean Baxter Deziyah Arvin, personally viewed and evaluated these images (plain radiographs) as part of my medical decision making, as well as reviewing the written report by the radiologist.  ED MD interpretation: CT imaging and ultrasound showed cholelithiasis without cholecystitis.  No dissection or aneurysm.  Official radiology report(s): DG Chest 2 View  Result Date: 04/09/2021 CLINICAL DATA:  Dyspnea EXAM: CHEST - 2 VIEW COMPARISON:  None. FINDINGS: Lungs are well expanded, symmetric, and clear. No pneumothorax or pleural effusion. Cardiac size within normal limits. Mitral valve replacement has been performed. Pulmonary vascularity is normal. Osseous structures are age-appropriate. No acute bone  abnormality. IMPRESSION: No active cardiopulmonary disease. Electronically Signed   By: Helyn Numbers M.D.   On: 04/09/2021 23:23   CT Angio Chest/Abd/Pel for Dissection W and/or Wo Contrast  Result Date: 04/10/2021 CLINICAL DATA:  Abdominal pain.  Concern for aortic dissection. EXAM: CT ANGIOGRAPHY CHEST, ABDOMEN AND PELVIS TECHNIQUE: Non-contrast CT of the chest was initially obtained. Multidetector CT imaging through the chest, abdomen and pelvis was performed using the standard protocol during bolus administration of intravenous contrast. Multiplanar reconstructed images and MIPs were obtained and reviewed to evaluate the vascular anatomy. CONTRAST:  OMNIPAQUE IOHEXOL 350 MG/ML SOLN COMPARISON:  CT of the abdomen pelvis dated 06/28/2017. FINDINGS: CTA CHEST FINDINGS Cardiovascular: There is no cardiomegaly or pericardial effusion. There is coronary vascular calcification. The thoracic aorta is unremarkable. No aneurysmal dilatation or dissection. The origins of the great vessels of the aortic arch appear patent as visualized. The central pulmonary arteries are unremarkable with the degree of opacification. Mediastinum/Nodes: No hilar or mediastinal adenopathy. The esophagus and the thyroid gland are grossly unremarkable. No mediastinal fluid collection. Lungs/Pleura: Minimal bibasilar linear atelectasis/scarring. No focal consolidation, pleural effusion, or pneumothorax. The central airways are patent. Musculoskeletal: Degenerative changes of the spine. No acute osseous pathology. Review of the MIP images confirms the above findings. CTA ABDOMEN AND PELVIS FINDINGS VASCULAR Aorta: Mild atherosclerotic calcification. No aneurysmal dilatation or dissection. No periaortic fluid collection. Celiac: Patent without evidence of aneurysm, dissection, vasculitis or significant stenosis. SMA: Patent without evidence of aneurysm, dissection, vasculitis or significant stenosis. Renals: Both renal arteries are  patent without evidence of aneurysm, dissection, vasculitis, fibromuscular dysplasia or significant stenosis. IMA: Patent without evidence of aneurysm, dissection, vasculitis or significant stenosis. Inflow: Patent without evidence of aneurysm, dissection, vasculitis or significant stenosis. Veins: No obvious venous abnormality within the limitations of this arterial phase study. Review of the MIP images confirms the above findings. NON-VASCULAR No intra-abdominal free air or free fluid. Hepatobiliary: Subcentimeter hypodense focus in the right lobe of the liver is too small to characterize. No intrahepatic biliary dilatation. The gallbladder is filled with stones. No pericholecystic fluid or evidence of acute cholecystitis by CT. Pancreas: Unremarkable. No pancreatic ductal dilatation or surrounding inflammatory changes. Spleen: Normal in size without focal abnormality. Adrenals/Urinary Tract: The adrenal glands unremarkable the kidneys, visualized ureters, and urinary bladder appear unremarkable. Stomach/Bowel: There is sigmoid diverticulosis without active inflammatory changes. Moderate stool throughout the colon. There is no bowel obstruction or active inflammation. The appendix is normal. Lymphatic: No adenopathy. Reproductive: The  prostate and seminal vesicles are grossly unremarkable. No pelvic mass. Other: None Musculoskeletal: No acute or significant osseous findings. Review of the MIP images confirms the above findings. IMPRESSION: 1. No acute intrathoracic, abdominal, or pelvic pathology. No aortic aneurysm or dissection. 2. Cholelithiasis. 3. Sigmoid diverticulosis. No bowel obstruction. Normal appendix. Electronically Signed   By: Elgie Collard M.D.   On: 04/10/2021 03:10   US ABDOMEN LIMITED RUQ (LIVER/GB)  Result Date: 04/10/2021 CLINICAL DATA:  60 year old male with upper back pain. EXAM: ULTRASOUND ABDOMEN LIMITED RIGHT UPPER QUADRANT COMPARISON:  CTA CT Chest, Abdomen, and Pelvis today 0226  hours. FINDINGS: Gallbladder: Numerous gallstones, individually estimated up to 7 mm diameter. Gallbladder wall thickness remains normal at 2 mm. No pericholecystic fluid. No sonographic Murphy sign elicited, although the technologist notes the patient was pre-medicated. Common bile duct: Diameter: 5 mm, normal. Liver: Liver echogenicity at the upper limits of normal. No discrete liver lesion. No intrahepatic biliary ductal dilatation. Portal vein is patent on color Doppler imaging with normal direction of blood flow towards the liver. Other: Negative visible right kidney. IMPRESSION: Numerous gallstones. But no evidence of acute cholecystitis or bile duct obstruction. Electronically Signed   By: Odessa Fleming M.D.   On: 04/10/2021 06:14    ____________________________________________   PROCEDURES  Procedure(s) performed (including Critical Care):  Procedures    ____________________________________________   INITIAL IMPRESSION / ASSESSMENT AND PLAN / ED COURSE  As part of my medical decision making, I reviewed the following data within the electronic MEDICAL RECORD NUMBER History obtained from family, Nursing notes reviewed and incorporated, Labs reviewed , EKG interpreted , Old EKG reviewed, CT and ultrasound reviewed, and Notes from prior ED visits         Patient here with multiple vague symptoms.  Differential is quite large and does include ACS, PE, dissection, aneurysm, musculoskeletal pain, pneumonia, UTI, kidney stone, pyelonephritis, pancreatitis, cholelithiasis, cholecystitis.  EKG is nonischemic.  First troponin negative.  Repeat pending.  Chest x-ray clear showing no infiltrate, pneumothorax, edema.  No widened mediastinum.  Will obtain CT of the chest, abdomen pelvis.  He declines any pain medication at this time.  ED PROGRESS  3:35 AM  Patient CT scan shows cholelithiasis without signs of cholecystitis or obstruction.  No dissection or aneurysm seen.  No other acute abnormality.  He  does have some elevation of his AST and ALT.  Will obtain right upper quadrant ultrasound for further evaluation.  Second troponin negative.  6:40 AM  Pt is still very well-appearing and in no distress.  Ultrasound shows cholelithiasis without cholecystitis or obstruction.  I recommended low-fat diet and close outpatient general surgery follow-up if symptoms continue.  Discussed return precautions.  Will discharge with pain and nausea medicine.  Patient and wife are comfortable with this plan.  At this time, I do not feel there is any life-threatening condition present. I have reviewed, interpreted and discussed all results (EKG, imaging, lab, urine as appropriate) and exam findings with patient/family. I have reviewed nursing notes and appropriate previous records.  I feel the patient is safe to be discharged home without further emergent workup and can continue workup as an outpatient as needed. Discussed usual and customary return precautions. Patient/family verbalize understanding and are comfortable with this plan.  Outpatient follow-up has been provided as needed. All questions have been answered.  ____________________________________________   FINAL CLINICAL IMPRESSION(S) / ED DIAGNOSES  Final diagnoses:  Acute bilateral thoracic back pain  Gallstone  Upper back pain  ED Discharge Orders          Ordered    HYDROcodone-acetaminophen (NORCO/VICODIN) 5-325 MG tablet  Every 4 hours PRN        04/10/21 0635    ondansetron (ZOFRAN ODT) 4 MG disintegrating tablet  Every 6 hours PRN        04/10/21 0350            *Please note:  OLLIVER BOYADJIAN was evaluated in Emergency Department on 04/10/2021 for the symptoms described in the history of present illness. He was evaluated in the context of the global COVID-19 pandemic, which necessitated consideration that the patient might be at risk for infection with the SARS-CoV-2 virus that causes COVID-19. Institutional protocols and  algorithms that pertain to the evaluation of patients at risk for COVID-19 are in a state of rapid change based on information released by regulatory bodies including the CDC and federal and state organizations. These policies and algorithms were followed during the patient's care in the ED.  Some ED evaluations and interventions may be delayed as a result of limited staffing during and the pandemic.*   Note:  This document was prepared using Dragon voice recognition software and may include unintentional dictation errors.    Kathelyn Gombos, Layla Maw, DO 04/10/21 [phone]

## 2021-04-10 NOTE — Discharge Instructions (Addendum)

## 2021-04-10 NOTE — ED Notes (Addendum)
Called Pt for repeat troponin, no answer. Called Pt mobile number on file and got a busy signal.

## 2021-04-13 ENCOUNTER — Ambulatory Visit: Payer: Self-pay | Admitting: Surgery

## 2021-04-13 NOTE — H&P (Signed)
Subjective:   CC: Biliary colic [K80.50]  HPI:  Andrew BURLINGAME Sr. is a 60 y.o. male who was referred by Emergency Room for evaluation of above CC. Symptoms were first noted several days ago. Pain is sharp and intense, confined to the epigastric area and midback, without radiation.  Associated with N/V, exacerbated by nothing specific.  Full workup including CTA for possible aortic aneurysm and cardiac workup negative.  Gallstones noted with slight increase in LFTs     Past Medical History:  has a past medical history of Mitral and aortic heart valve diseases, unspecified.  Past Surgical History:  has a past surgical history that includes Repair Mitral Valve (2008) and Cranioplasty For Repair Skull Defect W/Reparative Brain Surgery.  Family History: family history is not on file.  Social History:  reports that he has never smoked. He has never used smokeless tobacco. He reports that he does not drink alcohol and does not use drugs.  Current Medications: has a current medication list which includes the following prescription(s): amoxicillin, aspirin, calcium carb/magnesium carb, cyanocobalamin (vitamin b-12), finasteride, finasteride, loratadine, magnesium, and multivitamin.  Allergies:  Allergies as of 04/13/2021 - Reviewed 04/13/2021  Allergen Reaction Noted   Levaquin [levofloxacin] Unknown     ROS:  A 15 point review of systems was performed and pertinent positives and negatives noted in HPI    Objective:     BP 111/74   Pulse 82   Ht 177.8 cm (5\' 10" )   Wt 84.4 kg (186 lb)   BMI 26.69 kg/m    Constitutional :  alert, appears stated age, cooperative and no distress  Lymphatics/Throat:  no asymmetry, masses, or scars  Respiratory:  clear to auscultation bilaterally  Cardiovascular:  regular rate and rhythm  Gastrointestinal: soft, non-tender; bowel sounds normal; no masses,  no organomegaly.    Musculoskeletal: Steady gait and movement  Skin: Cool and moist, no  abdominal surgical scar  Psychiatric: Normal affect, non-agitated, not confused       LABS:  AST- 63 ALT- 136   RADS: CLINICAL DATA:  Abdominal pain.  Concern for aortic dissection.   EXAM: CT ANGIOGRAPHY CHEST, ABDOMEN AND PELVIS   TECHNIQUE: Non-contrast CT of the chest was initially obtained.   Multidetector CT imaging through the chest, abdomen and pelvis was performed using the standard protocol during bolus administration of intravenous contrast. Multiplanar reconstructed images and MIPs were obtained and reviewed to evaluate the vascular anatomy.   CONTRAST:  OMNIPAQUE IOHEXOL 350 MG/ML SOLN   COMPARISON:  CT of the abdomen pelvis dated 06/28/2017.   FINDINGS: CTA CHEST FINDINGS   Cardiovascular: There is no cardiomegaly or pericardial effusion. There is coronary vascular calcification. The thoracic aorta is unremarkable. No aneurysmal dilatation or dissection. The origins of the great vessels of the aortic arch appear patent as visualized. The central pulmonary arteries are unremarkable with the degree of opacification.   Mediastinum/Nodes: No hilar or mediastinal adenopathy. The esophagus and the thyroid gland are grossly unremarkable. No mediastinal fluid collection.   Lungs/Pleura: Minimal bibasilar linear atelectasis/scarring. No focal consolidation, pleural effusion, or pneumothorax. The central airways are patent.   Musculoskeletal: Degenerative changes of the spine. No acute osseous pathology.   Review of the MIP images confirms the above findings.   CTA ABDOMEN AND PELVIS FINDINGS   VASCULAR   Aorta: Mild atherosclerotic calcification. No aneurysmal dilatation or dissection. No periaortic fluid collection.   Celiac: Patent without evidence of aneurysm, dissection, vasculitis or significant stenosis.  SMA: Patent without evidence of aneurysm, dissection, vasculitis or significant stenosis.   Renals: Both renal arteries are patent  without evidence of aneurysm, dissection, vasculitis, fibromuscular dysplasia or significant stenosis.   IMA: Patent without evidence of aneurysm, dissection, vasculitis or significant stenosis.   Inflow: Patent without evidence of aneurysm, dissection, vasculitis or significant stenosis.   Veins: No obvious venous abnormality within the limitations of this arterial phase study.   Review of the MIP images confirms the above findings.   NON-VASCULAR   No intra-abdominal free air or free fluid.   Hepatobiliary: Subcentimeter hypodense focus in the right lobe of the liver is too small to characterize. No intrahepatic biliary dilatation. The gallbladder is filled with stones. No pericholecystic fluid or evidence of acute cholecystitis by CT.   Pancreas: Unremarkable. No pancreatic ductal dilatation or surrounding inflammatory changes.   Spleen: Normal in size without focal abnormality.   Adrenals/Urinary Tract: The adrenal glands unremarkable the kidneys, visualized ureters, and urinary bladder appear unremarkable.   Stomach/Bowel: There is sigmoid diverticulosis without active inflammatory changes. Moderate stool throughout the colon. There is no bowel obstruction or active inflammation. The appendix is normal.   Lymphatic: No adenopathy.   Reproductive: The prostate and seminal vesicles are grossly unremarkable. No pelvic mass.   Other: None   Musculoskeletal: No acute or significant osseous findings.   Review of the MIP images confirms the above findings.   IMPRESSION: 1. No acute intrathoracic, abdominal, or pelvic pathology. No aortic aneurysm or dissection. 2. Cholelithiasis. 3. Sigmoid diverticulosis. No bowel obstruction. Normal appendix.     Electronically Signed   By: Elgie Collard M.D.   On: 04/10/2021 03:10  CLINICAL DATA:  60 year old male with upper back pain.   EXAM: ULTRASOUND ABDOMEN LIMITED RIGHT UPPER QUADRANT   COMPARISON:  CTA CT  Chest, Abdomen, and Pelvis today 0226 hours.   FINDINGS: Gallbladder:   Numerous gallstones, individually estimated up to 7 mm diameter. Gallbladder wall thickness remains normal at 2 mm. No pericholecystic fluid. No sonographic Murphy sign elicited, although the technologist notes the patient was pre-medicated.   Common bile duct:   Diameter: 5 mm, normal.   Liver:   Liver echogenicity at the upper limits of normal. No discrete liver lesion. No intrahepatic biliary ductal dilatation. Portal vein is patent on color Doppler imaging with normal direction of blood flow towards the liver.   Other: Negative visible right kidney.   IMPRESSION: Numerous gallstones. But no evidence of acute cholecystitis or bile duct obstruction.     Electronically Signed   By: Odessa Fleming M.D.   On: 04/10/2021 06:14   Assessment:      Biliary colic [K80.50] [place] and CTA images reviewed and agree with above reports.  With increased LFTs and severity of episode, along with additional similar episodes in the past, will proceed with lap chole to see if we can prevent further issues.  Plan:     1. Biliary colic [K80.50] Discussed the risk of surgery including post-op infxn, seroma, biloma, chronic pain, poor-delayed wound healing, retained gallstone, conversion to open procedure, post-op SBO or ileus, and need for additional procedures to address said risks.  The risks of general anesthetic including MI, CVA, sudden death or even reaction to anesthetic medications also discussed. Alternatives include continued observation.  Benefits include possible symptom relief, prevention of complications including acute cholecystitis, pancreatitis.  Typical post operative recovery of 3-5 days rest, continued pain in area and incision sites, possible loose stools up to 4-6  weeks, also discussed.  ED return precautions given for sudden increase in RUQ pain, with possible accompanying fever, nausea, and/or  vomiting.  The patient understands the risks, any and all questions were answered to the patient's satisfaction.  2. Patient has elected to proceed with surgical treatment. Procedure will be scheduled.  robotic assisted laparoscopic

## 2021-04-13 NOTE — H&P (View-Only) (Signed)
Subjective:   CC: Biliary colic [K80.50]  HPI:  Andrew BURLINGAME Sr. is a 60 y.o. male who was referred by Emergency Room for evaluation of above CC. Symptoms were first noted several days ago. Pain is sharp and intense, confined to the epigastric area and midback, without radiation.  Associated with N/V, exacerbated by nothing specific.  Full workup including CTA for possible aortic aneurysm and cardiac workup negative.  Gallstones noted with slight increase in LFTs     Past Medical History:  has a past medical history of Mitral and aortic heart valve diseases, unspecified.  Past Surgical History:  has a past surgical history that includes Repair Mitral Valve (2008) and Cranioplasty For Repair Skull Defect W/Reparative Brain Surgery.  Family History: family history is not on file.  Social History:  reports that he has never smoked. He has never used smokeless tobacco. He reports that he does not drink alcohol and does not use drugs.  Current Medications: has a current medication list which includes the following prescription(s): amoxicillin, aspirin, calcium carb/magnesium carb, cyanocobalamin (vitamin b-12), finasteride, finasteride, loratadine, magnesium, and multivitamin.  Allergies:  Allergies as of 04/13/2021 - Reviewed 04/13/2021  Allergen Reaction Noted   Levaquin [levofloxacin] Unknown     ROS:  A 15 point review of systems was performed and pertinent positives and negatives noted in HPI    Objective:     BP 111/74   Pulse 82   Ht 177.8 cm (5\' 10" )   Wt 84.4 kg (186 lb)   BMI 26.69 kg/m    Constitutional :  alert, appears stated age, cooperative and no distress  Lymphatics/Throat:  no asymmetry, masses, or scars  Respiratory:  clear to auscultation bilaterally  Cardiovascular:  regular rate and rhythm  Gastrointestinal: soft, non-tender; bowel sounds normal; no masses,  no organomegaly.    Musculoskeletal: Steady gait and movement  Skin: Cool and moist, no  abdominal surgical scar  Psychiatric: Normal affect, non-agitated, not confused       LABS:  AST- 63 ALT- 136   RADS: CLINICAL DATA:  Abdominal pain.  Concern for aortic dissection.   EXAM: CT ANGIOGRAPHY CHEST, ABDOMEN AND PELVIS   TECHNIQUE: Non-contrast CT of the chest was initially obtained.   Multidetector CT imaging through the chest, abdomen and pelvis was performed using the standard protocol during bolus administration of intravenous contrast. Multiplanar reconstructed images and MIPs were obtained and reviewed to evaluate the vascular anatomy.   CONTRAST:  OMNIPAQUE IOHEXOL 350 MG/ML SOLN   COMPARISON:  CT of the abdomen pelvis dated 06/28/2017.   FINDINGS: CTA CHEST FINDINGS   Cardiovascular: There is no cardiomegaly or pericardial effusion. There is coronary vascular calcification. The thoracic aorta is unremarkable. No aneurysmal dilatation or dissection. The origins of the great vessels of the aortic arch appear patent as visualized. The central pulmonary arteries are unremarkable with the degree of opacification.   Mediastinum/Nodes: No hilar or mediastinal adenopathy. The esophagus and the thyroid gland are grossly unremarkable. No mediastinal fluid collection.   Lungs/Pleura: Minimal bibasilar linear atelectasis/scarring. No focal consolidation, pleural effusion, or pneumothorax. The central airways are patent.   Musculoskeletal: Degenerative changes of the spine. No acute osseous pathology.   Review of the MIP images confirms the above findings.   CTA ABDOMEN AND PELVIS FINDINGS   VASCULAR   Aorta: Mild atherosclerotic calcification. No aneurysmal dilatation or dissection. No periaortic fluid collection.   Celiac: Patent without evidence of aneurysm, dissection, vasculitis or significant stenosis.  SMA: Patent without evidence of aneurysm, dissection, vasculitis or significant stenosis.   Renals: Both renal arteries are patent  without evidence of aneurysm, dissection, vasculitis, fibromuscular dysplasia or significant stenosis.   IMA: Patent without evidence of aneurysm, dissection, vasculitis or significant stenosis.   Inflow: Patent without evidence of aneurysm, dissection, vasculitis or significant stenosis.   Veins: No obvious venous abnormality within the limitations of this arterial phase study.   Review of the MIP images confirms the above findings.   NON-VASCULAR   No intra-abdominal free air or free fluid.   Hepatobiliary: Subcentimeter hypodense focus in the right lobe of the liver is too small to characterize. No intrahepatic biliary dilatation. The gallbladder is filled with stones. No pericholecystic fluid or evidence of acute cholecystitis by CT.   Pancreas: Unremarkable. No pancreatic ductal dilatation or surrounding inflammatory changes.   Spleen: Normal in size without focal abnormality.   Adrenals/Urinary Tract: The adrenal glands unremarkable the kidneys, visualized ureters, and urinary bladder appear unremarkable.   Stomach/Bowel: There is sigmoid diverticulosis without active inflammatory changes. Moderate stool throughout the colon. There is no bowel obstruction or active inflammation. The appendix is normal.   Lymphatic: No adenopathy.   Reproductive: The prostate and seminal vesicles are grossly unremarkable. No pelvic mass.   Other: None   Musculoskeletal: No acute or significant osseous findings.   Review of the MIP images confirms the above findings.   IMPRESSION: 1. No acute intrathoracic, abdominal, or pelvic pathology. No aortic aneurysm or dissection. 2. Cholelithiasis. 3. Sigmoid diverticulosis. No bowel obstruction. Normal appendix.     Electronically Signed   By: Arash  Radparvar M.D.   On: 04/10/2021 03:10  CLINICAL DATA:  60-year-old male with upper back pain.   EXAM: ULTRASOUND ABDOMEN LIMITED RIGHT UPPER QUADRANT   COMPARISON:  CTA CT  Chest, Abdomen, and Pelvis today 0226 hours.   FINDINGS: Gallbladder:   Numerous gallstones, individually estimated up to 7 mm diameter. Gallbladder wall thickness remains normal at 2 mm. No pericholecystic fluid. No sonographic Murphy sign elicited, although the technologist notes the patient was pre-medicated.   Common bile duct:   Diameter: 5 mm, normal.   Liver:   Liver echogenicity at the upper limits of normal. No discrete liver lesion. No intrahepatic biliary ductal dilatation. Portal vein is patent on color Doppler imaging with normal direction of blood flow towards the liver.   Other: Negative visible right kidney.   IMPRESSION: Numerous gallstones. But no evidence of acute cholecystitis or bile duct obstruction.     Electronically Signed   By: H  Hall M.D.   On: 04/10/2021 06:14   Assessment:      Biliary colic [K80.50] US and CTA images reviewed and agree with above reports.  With increased LFTs and severity of episode, along with additional similar episodes in the past, will proceed with lap chole to see if we can prevent further issues.  Plan:     1. Biliary colic [K80.50] Discussed the risk of surgery including post-op infxn, seroma, biloma, chronic pain, poor-delayed wound healing, retained gallstone, conversion to open procedure, post-op SBO or ileus, and need for additional procedures to address said risks.  The risks of general anesthetic including MI, CVA, sudden death or even reaction to anesthetic medications also discussed. Alternatives include continued observation.  Benefits include possible symptom relief, prevention of complications including acute cholecystitis, pancreatitis.  Typical post operative recovery of 3-5 days rest, continued pain in area and incision sites, possible loose stools up to 4-6   weeks, also discussed.  ED return precautions given for sudden increase in RUQ pain, with possible accompanying fever, nausea, and/or  vomiting.  The patient understands the risks, any and all questions were answered to the patient's satisfaction.  2. Patient has elected to proceed with surgical treatment. Procedure will be scheduled.  robotic assisted laparoscopic  

## 2021-04-20 ENCOUNTER — Other Ambulatory Visit: Payer: Self-pay

## 2021-04-20 ENCOUNTER — Other Ambulatory Visit
Admission: RE | Admit: 2021-04-20 | Discharge: 2021-04-20 | Disposition: A | Discharge status: Home / Self Care | Payer: 59 | Source: Ambulatory Visit | Attending: Surgery | Admitting: Surgery

## 2021-04-20 HISTORY — DX: Pneumonia, unspecified organism: J18.9

## 2021-04-20 HISTORY — DX: Prediabetes: R73.03

## 2021-04-20 HISTORY — DX: Other complications of anesthesia, initial encounter: T88.59XA

## 2021-04-20 NOTE — Patient Instructions (Addendum)
Your procedure is scheduled on: 04/30/21 - Friday Report to the Registration Desk on the 1st floor of the [facility]. To find out your arrival time, please call [phone] between 1PM - 3PM on: 04/29/21 - Thursday  REMEMBER: Instructions that are not followed completely may result in serious medical risk, up to and including death; or upon the discretion of your surgeon and anesthesiologist your surgery may need to be rescheduled.  Do not eat food after midnight the night before surgery.  No gum chewing, lozengers or hard candies.  You may however, drink CLEAR liquids up to 2 hours before you are scheduled to arrive for your surgery. Do not drink anything within 2 hours of your scheduled arrival time.  Clear liquids include: - water  - apple juice without pulp - gatorade (not RED, PURPLE, OR BLUE) - black coffee or tea (Do NOT add milk or creamers to the coffee or tea) Do NOT drink anything that is not on this list.  TAKE THESE MEDICATIONS THE MORNING OF SURGERY WITH A SIP OF WATER: NONE  Follow recommendations from Cardiologist, Pulmonologist or PCP regarding stopping Aspirin, Coumadin, Plavix, Eliquis, Pradaxa, or Pletal. Stop taking Aspirin 81 mg beginning 04/23/21.  One week prior to surgery: Stop Anti-inflammatories (NSAIDS) such as Advil, Aleve, Ibuprofen, Motrin, Naproxen, Naprosyn and Aspirin based products such as Excedrin, Goodys Powder, BC Powder.  Stop ANY OVER THE COUNTER supplements until after surgery. Stop taking all beginning 04/23/21 .  You may take Tylenol if needed for pain up until the day of surgery.  No Alcohol for 24 hours before or after surgery.  No Smoking including e-cigarettes for 24 hours prior to surgery.  No chewable tobacco products for at least 6 hours prior to surgery.  No nicotine patches on the day of surgery.  Do not use any "recreational" drugs for at least a week prior to your surgery.  Please be advised that the combination of  cocaine and anesthesia may have negative outcomes, up to and including death. If you test positive for cocaine, your surgery will be cancelled.  On the morning of surgery brush your teeth with toothpaste and water, you may rinse your mouth with mouthwash if you wish. Do not swallow any toothpaste or mouthwash.  Use CHG Soap or wipes as directed on instruction sheet.  Do not wear jewelry, make-up, hairpins, clips or nail polish.  Do not wear lotions, powders, or perfumes.   Do not shave body from the neck down 48 hours prior to surgery just in case you cut yourself which could leave a site for infection.  Also, freshly shaved skin may become irritated if using the CHG soap.  Contact lenses, hearing aids and dentures may not be worn into surgery.  Do not bring valuables to the hospital. [facility] is not responsible for any missing/lost belongings or valuables.   Notify your doctor if there is any change in your medical condition (cold, fever, infection).  Wear comfortable clothing (specific to your surgery type) to the hospital.  After surgery, you can help prevent lung complications by doing breathing exercises.  Take deep breaths and cough every 1-2 hours. Your doctor may order a device called an Incentive Spirometer to help you take deep breaths. When coughing or sneezing, hold a pillow firmly against your incision with both hands. This is called "splinting." Doing this helps protect your incision. It also decreases belly discomfort.  If you are being admitted to the hospital overnight, leave your suitcase  in the car. After surgery it may be brought to your room.  If you are being discharged the day of surgery, you will not be allowed to drive home. You will need a responsible adult (18 years or older) to drive you home and stay with you that night.   If you are taking public transportation, you will need to have a responsible adult (18 years or older) with you. Please confirm  with your physician that it is acceptable to use public transportation.   Please call the Pre-admissions Testing Dept. at [phone] if you have any questions about these instructions.  Surgery Visitation Policy:  Patients undergoing a surgery or procedure may have one family member or support person with them as long as that person is not COVID-19 positive or experiencing its symptoms.  That person may remain in the waiting area during the procedure and may rotate out with other people.  Inpatient Visitation:    Visiting hours are 7 a.m. to 8 p.m. Up to two visitors ages 16+ are allowed at one time in a patient room. The visitors may rotate out with other people during the day. Visitors must check out when they leave, or other visitors will not be allowed. One designated support person may remain overnight. The visitor must pass COVID-19 screenings, use hand sanitizer when entering and exiting the patient's room and wear a mask at all times, including in the patient's room. Patients must also wear a mask when staff or their visitor are in the room. Masking is required regardless of vaccination status.

## 2021-04-21 ENCOUNTER — Other Ambulatory Visit: Payer: Self-pay | Admitting: Internal Medicine

## 2021-04-26 ENCOUNTER — Encounter: Payer: Self-pay | Admitting: Surgery

## 2021-04-26 MED ORDER — FINASTERIDE 5 MG PO TABS: 5.0000 mg | ORAL_TABLET | Freq: Every day | ORAL | 0 refills | Status: DC

## 2021-04-26 NOTE — Progress Notes (Signed)
Perioperative Services  Pre-Admission/Anesthesia Testing Clinical Review  Date: 04/26/21  Patient Demographics:  Name: Andrew Porter DOB:   1960/12/10 MRN:   [id number]  Planned Surgical Procedure(s):    Case: [id number] Date/Time: 04/30/21 0715   Procedure: XI ROBOTIC ASSISTED LAPAROSCOPIC CHOLECYSTECTOMY (Abdomen)   Anesthesia type: General   Pre-op diagnosis: Biliary colic K80.50   Location: [facility] OR ROOM 04 / [facility]   Surgeons: Sung Amabile, DO     NOTE: Available PAT nursing documentation and vital signs have been reviewed. Clinical nursing staff has updated patient's PMH/PSHx, current medication list, and drug allergies/intolerances to ensure comprehensive history available to assist in medical decision making as it pertains to the aforementioned surgical procedure and anticipated anesthetic course. Extensive review of available clinical information performed. [facility] PMH and PSHx updated with any diagnoses/procedures that  may have been inadvertently omitted during his intake with the pre-admission testing department's nursing staff.  Clinical Discussion:  Andrew Porter is a 60 y.o. male who is submitted for pre-surgical anesthesia review and clearance prior to him undergoing the above procedure. Patient has never been a smoker. Pertinent PMH includes: CAD, CHF, mitral valve stenosis (s/p MVR), valvular regurgitation, exertional angina, prediabetes, BPH.  Patient is followed by cardiology Regino Schultze, MD). He was last seen in the cardiology clinic on 02/03/2021; notes reviewed.  At the time of his clinic visit, patient complained of episodes of chest pain with associated shortness of breath.  Pain in his chest associated with exertion (playing tennis).  Patient notes that the pain is localized to the LEFT side of his chest and LEFT subscapular area.  Pain described as sharp in nature; relieved with rest.  Patient advising that he experiences lower  extremity weakness.  No PND, orthopnea, palpitations, significant peripheral edema, vertiginous symptoms, or presyncope/syncope.  PMH significant for cardiovascular diagnoses.  Patient with a history of severe mitral valve stenosis and prolapse.  He underwent a MVR via a RIGHT thoracotomy approach back in 10/2006.  32 mm ring with artificial cords placed.  Follow-up TTE performed on 11/24/2006 revealed normal left ventricular systolic function with an LVEF of 50%.  There was trivial PR and TR.  Bioprosthetic mitral valve ring well-seated; mean gradient 4 mmHg.  Following his valve repair, patient has undergone surveillance monitoring of his bioprosthetic valve.  Last TTE performed on 10/01/2018 revealed mild left ventricular systolic dysfunction with mild LVH; LVEF 50%.  Left ventricular GLS -15.2%.  There was trivial MR and TR, in addition to mild PR.  Bioprosthetic mitral valve ring well-seated; mean gradient 2 mmHg.  CTA with FFR performed on 02/10/2021 revealing mild nonobstructive CAD; 30% stenosis distal left main, 30-50% stenosis proximal LAD, 30% stenosis of proximal D1, and 30% stenosis mid RCA.  FFR analysis demonstrated the following values: 0.96 proximal LAD, 0.91 mid LAD, 0.84 distal LAD, 0.87 distal LCx, 0.94 mid RCA, and 0.93 distal RCA  Blood pressure reasonably controlled at 130/74; takes no interventions.  Patient is not currently taking any type of lipid-lowering therapies for ASCVD prevention.  Patient is not diabetic. Functional capacity, as defined by DASI, is documented as being >/= 4 METS.  No changes were made to patient's medication regimen.  Patient to follow-up with outpatient cardiology in 1 year or sooner if needed.  Andrew Porter is scheduled for an elective ROBOT ASSISTED LAPAROSCOPIC CHOLECYSTECTOMY on 04/30/2021 with Dr. Sung Amabile, MD. Given patient's past medical history significant for cardiovascular diagnoses, presurgical cardiac clearance was sought by  the  PAT team. Per cardiology, "this patient is optimized for surgery and may proceed with the planned procedural course with a LOW risk of significant perioperative cardiovascular complications". This patient is on daily antiplatelet therapy. He has been instructed on recommendations for holding his daily low-dose ASA for 5 days prior to his procedure with plans to restart as soon as postoperative bleeding risk felt to be minimized by his attending surgeon. The patient has been instructed that his last dose of his anticoagulant will be on 04/24/2021.  Patient reports previous perioperative complications with anesthesia in the past.  Patient has a PMH (+) for delayed emergence and intraoperative bradycardia. In review his EMR, there are no records available for review pertaining to past procedural/anesthetic courses within the [facility] system.   Vitals with BMI 04/10/2021 04/10/2021 04/10/2021  Height - - -  Weight - - -  BMI - - -  Systolic 127 125 017  Diastolic 84 86 91  Pulse 70 66 67    Providers/Specialists:   NOTE: Primary physician provider listed below. Patient may have been seen by APP or partner within same practice.   PROVIDER ROLE / SPECIALTY LAST Michelle Nasuti, DO GENERAL SURGERY 04/13/2021  Karie Schwalbe, MD PRIMARY CARE PROVIDER 05/04/2021  Nolon Rod, MD CARDIOLOGY 02/03/2021   Allergies:  Levofloxacin and Quinolones  Current Home Medications:   No current facility-administered medications for this encounter.    aspirin 81 MG tablet   Cholecalciferol (DIALYVITE VITAMIN D 5000 PO)   Cyanocobalamin (B-12) 5000 MCG SUBL   loratadine (CLARITIN) 10 MG tablet   Magnesium 500 MG TABS   Multiple Vitamin (MULTIVITAMIN) capsule   VITAMIN K PO   finasteride (PROSCAR) 5 MG tablet   HYDROcodone-acetaminophen (NORCO/VICODIN) 5-325 MG tablet   ondansetron (ZOFRAN ODT) 4 MG disintegrating tablet   History:   Past Medical History:  Diagnosis Date   B12 deficiency     BPH (benign prostatic hyperplasia)    CAD (coronary artery disease)    a.) FFRCT 02/10/2021 --> 30-50% pLAD, 30% dLM, 30% pD1, 30% mRCA. FFR VALUES --> 0.96 pLAD, 0.91 mLAD, 0.84 dLAD; 0.87 dLCx; 9.94 mRCA, 0.93 dRCA.   CHF (congestive heart failure) (HCC)    a.) TTE 10/01/2018 --> mild LV systolic dysfunction with mild LVH; LVEF 50%. LV GLS -15.2%.   Complication of anesthesia    a.) delayed emergence. b.) intraoperative bradycardia   Exertional chest pain    Large cisterna magna (HCC) 07/2008   a.) Tx'd with suboccipital craniotomy for fenestration and drainage; performed in [place].   Lyme disease 08/02/2003   Migraine with aura    Mitral stenosis    a.) s/p  MVR (32 mm ring with artificial cords) via RIGHT thoracotomy approach on 11/21/2006   MVP (mitral valve prolapse)    Pneumonia    Pre-diabetes    Sigmoid diverticulosis    Sleep disturbance    Valvular regurgitation    a.) TTE 10/01/2018 --> LVEF 50%; trivial MR, TR; mild PR.   Vitamin D deficiency    Past Surgical History:  Procedure Laterality Date   CRANIOTOMY FOR CYST FENESTRATION N/A 07/08/2008   Procedure: SUBOCCIPITAL CRANIOTOMY, C1 LAMINECTOMY, DURAPLSTY FOR ARACHNOID CYST FENESTRATION AND DRAINAGE OF LARGE CISTERNA MAGNA. Location: [facility]; Surgeon: Doretha Sou, MD.   INGUINAL HERNIA REPAIR Right 2000   Procedure: RIGHT INGUINAL HERNIA REPAIR; Location: [facility]; Surgeon: Donnalee Curry, MD   MITRAL VALVE REPAIR N/A 11/21/2006   Procedure: MITRAL  VALVE REPAIR (32 mm ring with artificial cords); Location: [facility]; Surgeon: Judd Gaudier, MD   THORACOTOMY Right 11/21/2006   Procedure: RIGHT THORACOTOMY FOR MITRAL VALVE REPAIR; Location: [facility]; Surgeon: Judd Gaudier, MD   Family History  Problem Relation Age of Onset   Coronary artery disease Father    Coronary artery disease Paternal Grandfather    Diabetes Neg Hx    Hypertension Neg Hx    Cancer Neg Hx    Social History   Tobacco Use    Smoking status: Never   Smokeless tobacco: Never  Vaping Use   Vaping Use: Never used  Substance Use Topics   Alcohol use: Yes    Comment: rarely   Drug use: No    Pertinent Clinical Results:  LABS: Labs reviewed: Acceptable for surgery.  No visits with results within 3 Day(s) from this visit.  Latest known visit with results is:  Admission on 04/10/2021, Discharged on 04/10/2021  Component Date Value Ref Range Status   Sodium 04/09/2021 139  135 - 145 mmol/L Final   Potassium 04/09/2021 3.6  3.5 - 5.1 mmol/L Final   Chloride 04/09/2021 102  98 - 111 mmol/L Final   CO2 04/09/2021 28  22 - 32 mmol/L Final   Glucose, Bld 04/09/2021 125 (A) 70 - 99 mg/dL Final   Glucose reference range applies only to samples taken after fasting for at least 8 hours.   BUN 04/09/2021 24 (A) 6 - 20 mg/dL Final   Creatinine, Ser 04/09/2021 1.10  0.61 - 1.24 mg/dL Final   Calcium 73/22/0254 9.3  8.9 - 10.3 mg/dL Final   GFR, Estimated 04/09/2021 >60  >60 mL/min Final   Anion gap 04/09/2021 9  5 - 15 Final   Performed at [facility], [address] [place], [place] [address]   Troponin I (High Sensitivity) 04/09/2021 6  <18 ng/L Final   WBC 04/09/2021 8.6  4.0 - 10.5 K/uL Final   RBC 04/09/2021 4.72  4.22 - 5.81 MIL/uL Final   Hemoglobin 04/09/2021 15.5  13.0 - 17.0 g/dL Final   HCT 37/62/8315 44.7  39.0 - 52.0 % Final   MCV 04/09/2021 94.7  80.0 - 100.0 fL Final   MCH 04/09/2021 32.8  26.0 - 34.0 pg Final   MCHC 04/09/2021 34.7  30.0 - 36.0 g/dL Final   RDW 17/61/6073 13.2  11.5 - 15.5 % Final   Platelets 04/09/2021 194  150 - 400 K/uL Final   nRBC 04/09/2021 0.0  0.0 - 0.2 % Final   Performed at [facility], [address] [place], [place] [address]   Troponin I (High Sensitivity) 04/10/2021 5  <18 ng/L Final   Total Protein 04/10/2021 7.0  6.5 - 8.1 g/dL Final   Albumin 69/48/5462 3.9  3.5 - 5.0 g/dL Final   AST 70/35/0093 63 (A) 15 - 41 U/L Final   ALT 04/10/2021 136  (A) 0 - 44 U/L Final   Alkaline Phosphatase 04/10/2021 57  38 - 126 U/L Final   Total Bilirubin 04/10/2021 0.4  0.3 - 1.2 mg/dL Final   Bilirubin, Direct 04/10/2021 <0.1  0.0 - 0.2 mg/dL Final   Indirect Bilirubin 04/10/2021 NOT CALCULATED  0.3 - 0.9 mg/dL Final   Performed at [facility], [address] [place], [place] [address]   Lipase 04/10/2021 31  11 - 51 U/L Final   Performed at [facility], [address] [place], [place] [address]   Magnesium 04/10/2021 2.3  1.7 -  2.4 mg/dL Final   Performed at [facility], [address] [place], [place] [address]   Color, Urine 04/10/2021 YELLOW  YELLOW Final   APPearance 04/10/2021 CLEAR  CLEAR Final   Specific Gravity, Urine 04/10/2021 1.010  1.005 - 1.030 Final   pH 04/10/2021 6.0  5.0 - 8.0 Final   Glucose, UA 04/10/2021 NEGATIVE  NEGATIVE mg/dL Final   Hgb urine dipstick 04/10/2021 TRACE (A) NEGATIVE Final   Bilirubin Urine 04/10/2021 NEGATIVE  NEGATIVE Final   Ketones, ur 04/10/2021 NEGATIVE  NEGATIVE mg/dL Final   Protein, ur 42/70/6237 NEGATIVE  NEGATIVE mg/dL Final   Nitrite 62/83/1517 NEGATIVE  NEGATIVE Final   Leukocytes,Ua 04/10/2021 NEGATIVE  NEGATIVE Final   RBC / HPF 04/10/2021 0-5  0 - 5 RBC/hpf Final   WBC, UA 04/10/2021 0-5  0 - 5 WBC/hpf Final   Bacteria, UA 04/10/2021 NONE SEEN  NONE SEEN Final   Squamous Epithelial / LPF 04/10/2021 NONE SEEN  0 - 5 Final   Mucus 04/10/2021 PRESENT   Final   Performed at [facility], [address] [place], [place] [address]   SARS Coronavirus 2 by RT PCR 04/10/2021 NEGATIVE  NEGATIVE Final   Influenza A by PCR 04/10/2021 NEGATIVE  NEGATIVE Final   Influenza B by PCR 04/10/2021 NEGATIVE  NEGATIVE Final    ECG: Date: 04/09/2021 Time ECG obtained: 2245 PM Rate: 79 bpm Rhythm: normal sinus Axis (leads I and aVF): Normal Intervals: PR 138 ms. QRS 86 ms. QTc 426 ms. ST segment and T wave changes: No evidence of acute ST segment elevation  or depression Comparison: Similar to previous tracing obtained on 02/03/2021   IMAGING / PROCEDURES: CTA HEART ANGIOGRAM WITH FFR performed on 02/10/2021 Normal origin and course of coronary arteries.  Diffuse soft plaque throughout the RCA with mild multifocal stenosis with up to 30% stenosis.  Mild to moderate calcifications in the proximal aspect of the LAD with 30-50% stenosis (CAD-RADS 2).  Mild luminal calcifications as well as scattered soft plaque in the mid/distal aspect of the left main coronary artery with less than 30% stenosis. CT-FFR Findings/Values: Left Anterior Descending System: 0.96 proximal, 0.91 mid, 0.84 distal  Left Circumflex System: 0.87 distal  Right Coronary System: 0.94 mid, 0.93 distal   TRANSTHORACIC ECHOCARDIOGRAM performed at 10/01/2018 LVEF 50% Mild left ventricular dysfunction with mild LVH Left ventricular GLS -15.2% Normal right ventricular systolic function Trivial MR and TR Mild PR No AR Bioprosthetic mitral valve ring; mean gradient two-point millimeters of mercury Mild right atrial enlargement  Impression and Plan:  Andrew Porter has been referred for pre-anesthesia review and clearance prior to him undergoing the planned anesthetic and procedural courses. Available labs, pertinent testing, and imaging results were personally reviewed by me. This patient has been appropriately cleared by cardiology with an overall LOW risk of significant perioperative cardiovascular complications.  Based on clinical review performed today (04/26/21), barring any significant acute changes in the patient's overall condition, it is anticipated that he will be able to proceed with the planned surgical intervention. Any acute changes in clinical condition may necessitate his procedure being postponed and/or cancelled. Patient will meet with anesthesia team (MD and/or CRNA) on the day of his procedure for preoperative evaluation/assessment. Questions regarding  anesthetic course will be fielded at that time.   Pre-surgical instructions were reviewed with the patient during his PAT appointment and questions were fielded by PAT clinical staff. Patient was advised that if any questions or concerns arise prior  to his procedure then he should return a call to PAT and/or his surgeon's office to discuss.  Quentin Mulling, MSN, APRN, FNP-C, CEN [facility]  Peri-operative Services Nurse Practitioner Phone: [phone] Fax: [phone] 04/26/21 5:07 PM  NOTE: This note has been prepared using Dragon dictation software. Despite my best ability to proofread, there is always the potential that unintentional transcriptional errors may still occur from this process.

## 2021-04-26 NOTE — Addendum Note (Signed)
Addended by: Eual Fines on: 04/26/2021 09:02 AM   Modules accepted: Orders

## 2021-04-30 ENCOUNTER — Ambulatory Visit: Payer: 59 | Admitting: Urgent Care

## 2021-04-30 ENCOUNTER — Ambulatory Visit
Admission: RE | Admit: 2021-04-30 | Discharge: 2021-04-30 | Disposition: A | Discharge status: Home / Self Care | Payer: 59 | Attending: Surgery | Admitting: Surgery

## 2021-04-30 ENCOUNTER — Encounter: Payer: Self-pay | Admitting: Surgery

## 2021-04-30 ENCOUNTER — Other Ambulatory Visit: Payer: Self-pay

## 2021-04-30 ENCOUNTER — Encounter
Admission: RE | Disposition: A | Discharge status: Home / Self Care | Payer: Self-pay | Source: Home / Self Care | Attending: Surgery

## 2021-04-30 DIAGNOSIS — K8010 Calculus of gallbladder with chronic cholecystitis without obstruction: Secondary | ICD-10-CM | POA: Diagnosis not present

## 2021-04-30 DIAGNOSIS — K5730 Diverticulosis of large intestine without perforation or abscess without bleeding: Secondary | ICD-10-CM | POA: Diagnosis not present

## 2021-04-30 DIAGNOSIS — Z7982 Long term (current) use of aspirin: Secondary | ICD-10-CM | POA: Insufficient documentation

## 2021-04-30 DIAGNOSIS — Z79899 Other long term (current) drug therapy: Secondary | ICD-10-CM | POA: Insufficient documentation

## 2021-04-30 DIAGNOSIS — K828 Other specified diseases of gallbladder: Secondary | ICD-10-CM | POA: Insufficient documentation

## 2021-04-30 DIAGNOSIS — Z881 Allergy status to other antibiotic agents status: Secondary | ICD-10-CM | POA: Insufficient documentation

## 2021-04-30 DIAGNOSIS — K811 Chronic cholecystitis: Secondary | ICD-10-CM

## 2021-04-30 DIAGNOSIS — K8060 Calculus of gallbladder and bile duct with cholecystitis, unspecified, without obstruction: Secondary | ICD-10-CM | POA: Diagnosis present

## 2021-04-30 HISTORY — DX: Benign prostatic hyperplasia without lower urinary tract symptoms: N40.0

## 2021-04-30 HISTORY — DX: Sleep disorder, unspecified: G47.9

## 2021-04-30 HISTORY — DX: Deficiency of other specified B group vitamins: E53.8

## 2021-04-30 HISTORY — DX: Atherosclerotic heart disease of native coronary artery without angina pectoris: I25.10

## 2021-04-30 HISTORY — DX: Chest pain, unspecified: R07.9

## 2021-04-30 HISTORY — DX: Endocarditis, valve unspecified: I38

## 2021-04-30 HISTORY — DX: Rheumatic mitral stenosis: I05.0

## 2021-04-30 HISTORY — DX: Vitamin D deficiency, unspecified: E55.9

## 2021-04-30 HISTORY — DX: Diverticulosis of large intestine without perforation or abscess without bleeding: K57.30

## 2021-04-30 HISTORY — DX: Heart failure, unspecified: I50.9

## 2021-04-30 SURGERY — CHOLECYSTECTOMY, ROBOT-ASSISTED, LAPAROSCOPIC
Anesthesia: General | Site: Abdomen

## 2021-04-30 MED ORDER — LIDOCAINE HCL (PF) 2 % IJ SOLN
INTRAMUSCULAR | Status: AC
  Filled 2021-04-30: qty 5

## 2021-04-30 MED ORDER — MIDAZOLAM HCL 2 MG/2ML IJ SOLN
INTRAMUSCULAR | Status: DC | PRN
  Administered 2021-04-30: 2 mg via INTRAVENOUS

## 2021-04-30 MED ORDER — CEFAZOLIN SODIUM-DEXTROSE 2-4 GM/100ML-% IV SOLN
INTRAVENOUS | Status: AC
  Filled 2021-04-30: qty 100

## 2021-04-30 MED ORDER — BUPIVACAINE HCL (PF) 0.5 % IJ SOLN
INTRAMUSCULAR | Status: AC
  Filled 2021-04-30: qty 30

## 2021-04-30 MED ORDER — ACETAMINOPHEN 325 MG PO TABS: 650.0000 mg | ORAL_TABLET | Freq: Three times a day (TID) | ORAL | 0 refills | Status: DC | PRN

## 2021-04-30 MED ORDER — FENTANYL CITRATE (PF) 100 MCG/2ML IJ SOLN
INTRAMUSCULAR | Status: DC | PRN
  Administered 2021-04-30 (×2): 50 ug via INTRAVENOUS

## 2021-04-30 MED ORDER — FAMOTIDINE 20 MG PO TABS
ORAL_TABLET | ORAL | Status: AC
  Administered 2021-04-30: 20 mg via ORAL
  Filled 2021-04-30: qty 1

## 2021-04-30 MED ORDER — LIDOCAINE-EPINEPHRINE (PF) 1 %-1:200000 IJ SOLN
INTRAMUSCULAR | Status: AC
  Filled 2021-04-30: qty 20

## 2021-04-30 MED ORDER — DOCUSATE SODIUM 100 MG PO CAPS: 100.0000 mg | ORAL_CAPSULE | Freq: Two times a day (BID) | ORAL | 0 refills | Status: AC | PRN

## 2021-04-30 MED ORDER — CHLORHEXIDINE GLUCONATE 0.12 % MT SOLN: 15.0000 mL | Freq: Once | OROMUCOSAL | Status: AC

## 2021-04-30 MED ORDER — ONDANSETRON HCL 4 MG/2ML IJ SOLN
INTRAMUSCULAR | Status: DC | PRN
  Administered 2021-04-30: 4 mg via INTRAVENOUS

## 2021-04-30 MED ORDER — EPHEDRINE 5 MG/ML INJ
INTRAVENOUS | Status: AC
  Filled 2021-04-30: qty 5

## 2021-04-30 MED ORDER — PROPOFOL 10 MG/ML IV BOLUS
INTRAVENOUS | Status: AC
  Filled 2021-04-30: qty 20

## 2021-04-30 MED ORDER — FAMOTIDINE 20 MG PO TABS: 20.0000 mg | ORAL_TABLET | Freq: Once | ORAL | Status: AC

## 2021-04-30 MED ORDER — MEPERIDINE HCL 25 MG/ML IJ SOLN: 6.2500 mg | INTRAMUSCULAR | Status: DC | PRN

## 2021-04-30 MED ORDER — ONDANSETRON HCL 4 MG/2ML IJ SOLN
INTRAMUSCULAR | Status: AC
  Filled 2021-04-30: qty 2

## 2021-04-30 MED ORDER — MIDAZOLAM HCL 2 MG/2ML IJ SOLN
INTRAMUSCULAR | Status: AC
  Filled 2021-04-30: qty 2

## 2021-04-30 MED ORDER — CHLORHEXIDINE GLUCONATE CLOTH 2 % EX PADS
6.0000 | MEDICATED_PAD | Freq: Once | CUTANEOUS | Status: AC
  Administered 2021-04-30: 6 via TOPICAL

## 2021-04-30 MED ORDER — OXYCODONE HCL 5 MG/5ML PO SOLN
5.0000 mg | Freq: Once | ORAL | Status: AC | PRN
Start: 2021-04-30 — End: 2021-04-30

## 2021-04-30 MED ORDER — OXYCODONE HCL 5 MG PO TABS
5.0000 mg | ORAL_TABLET | Freq: Once | ORAL | Status: AC | PRN
  Administered 2021-04-30: 5 mg via ORAL

## 2021-04-30 MED ORDER — IBUPROFEN 800 MG PO TABS: 800.0000 mg | ORAL_TABLET | Freq: Three times a day (TID) | ORAL | 0 refills | Status: DC | PRN

## 2021-04-30 MED ORDER — DEXAMETHASONE SODIUM PHOSPHATE 10 MG/ML IJ SOLN
INTRAMUSCULAR | Status: DC | PRN
  Administered 2021-04-30: 10 mg via INTRAVENOUS

## 2021-04-30 MED ORDER — PROPOFOL 10 MG/ML IV BOLUS
INTRAVENOUS | Status: DC | PRN
  Administered 2021-04-30: 140 mg via INTRAVENOUS

## 2021-04-30 MED ORDER — FENTANYL CITRATE (PF) 100 MCG/2ML IJ SOLN
INTRAMUSCULAR | Status: AC
  Administered 2021-04-30: 25 ug via INTRAVENOUS
  Filled 2021-04-30: qty 2

## 2021-04-30 MED ORDER — ROCURONIUM BROMIDE 100 MG/10ML IV SOLN
INTRAVENOUS | Status: DC | PRN
Start: 2021-04-30 — End: 2021-04-30
  Administered 2021-04-30: 50 mg via INTRAVENOUS

## 2021-04-30 MED ORDER — FENTANYL CITRATE (PF) 100 MCG/2ML IJ SOLN
25.0000 ug | INTRAMUSCULAR | Status: DC | PRN
  Administered 2021-04-30: 25 ug via INTRAVENOUS
  Administered 2021-04-30: 50 ug via INTRAVENOUS

## 2021-04-30 MED ORDER — 0.9 % SODIUM CHLORIDE (POUR BTL) OPTIME
TOPICAL | Status: DC | PRN
  Administered 2021-04-30: 500 mL

## 2021-04-30 MED ORDER — ACETAMINOPHEN 10 MG/ML IV SOLN
INTRAVENOUS | Status: DC | PRN
  Administered 2021-04-30: 1000 mg via INTRAVENOUS

## 2021-04-30 MED ORDER — FENTANYL CITRATE (PF) 100 MCG/2ML IJ SOLN
INTRAMUSCULAR | Status: AC
  Filled 2021-04-30: qty 2

## 2021-04-30 MED ORDER — ACETAMINOPHEN 10 MG/ML IV SOLN
INTRAVENOUS | Status: AC
  Filled 2021-04-30: qty 100

## 2021-04-30 MED ORDER — HYDROCODONE-ACETAMINOPHEN 5-325 MG PO TABS: 1.0000 | ORAL_TABLET | Freq: Four times a day (QID) | ORAL | 0 refills | Status: DC | PRN

## 2021-04-30 MED ORDER — PROMETHAZINE HCL 25 MG/ML IJ SOLN: 6.2500 mg | INTRAMUSCULAR | Status: DC | PRN

## 2021-04-30 MED ORDER — LIDOCAINE-EPINEPHRINE (PF) 1 %-1:200000 IJ SOLN
INTRAMUSCULAR | Status: DC | PRN
  Administered 2021-04-30: 20 mL via INTRAMUSCULAR

## 2021-04-30 MED ORDER — CHLORHEXIDINE GLUCONATE 0.12 % MT SOLN
OROMUCOSAL | Status: AC
  Administered 2021-04-30: 15 mL via OROMUCOSAL
  Filled 2021-04-30: qty 15

## 2021-04-30 MED ORDER — OXYCODONE HCL 5 MG PO TABS
ORAL_TABLET | ORAL | Status: AC
  Filled 2021-04-30: qty 1

## 2021-04-30 MED ORDER — CEFAZOLIN SODIUM-DEXTROSE 2-4 GM/100ML-% IV SOLN
2.0000 g | INTRAVENOUS | Status: AC
  Administered 2021-04-30: 2 g via INTRAVENOUS

## 2021-04-30 MED ORDER — LACTATED RINGERS IV SOLN: INTRAVENOUS | Status: DC

## 2021-04-30 MED ORDER — SUGAMMADEX SODIUM 200 MG/2ML IV SOLN
INTRAVENOUS | Status: DC | PRN
  Administered 2021-04-30: 200 mg via INTRAVENOUS

## 2021-04-30 MED ORDER — ORAL CARE MOUTH RINSE: 15.0000 mL | Freq: Once | OROMUCOSAL | Status: AC

## 2021-04-30 MED ORDER — EPHEDRINE SULFATE 50 MG/ML IJ SOLN
INTRAMUSCULAR | Status: DC | PRN
Start: 2021-04-30 — End: 2021-04-30
  Administered 2021-04-30 (×2): 5 mg via INTRAVENOUS

## 2021-04-30 MED ORDER — INDOCYANINE GREEN 25 MG IV SOLR
1.2500 mg | Freq: Once | INTRAVENOUS | Status: AC
  Administered 2021-04-30: 1.25 mg via INTRAVENOUS
  Filled 2021-04-30: qty 0.5

## 2021-04-30 MED ORDER — LIDOCAINE HCL (CARDIAC) PF 100 MG/5ML IV SOSY
PREFILLED_SYRINGE | INTRAVENOUS | Status: DC | PRN
  Administered 2021-04-30: 60 mg via INTRAVENOUS

## 2021-04-30 SURGICAL SUPPLY — 62 items
ADH SKN CLS APL DERMABOND .7 (GAUZE/BANDAGES/DRESSINGS) ×2
ANCHOR TIS RET SYS 235ML (MISCELLANEOUS) ×3 IMPLANT
APL PRP STRL LF DISP 70% ISPRP (MISCELLANEOUS) ×2
BAG INFUSER PRESSURE 100CC (MISCELLANEOUS) IMPLANT
BAG TISS RTRVL C235 10X14 (MISCELLANEOUS) ×2
BLADE SURG SZ11 CARB STEEL (BLADE) ×3 IMPLANT
CANNULA REDUC XI 12-8 STAPL (CANNULA) ×3
CANNULA REDUCER 12-8 DVNC XI (CANNULA) ×2 IMPLANT
CATH REDDICK CHOLANGI 4FR 50CM (CATHETERS) IMPLANT
CHLORAPREP W/TINT 26 (MISCELLANEOUS) ×3 IMPLANT
CLIP LIGATING HEMO O LOK GREEN (MISCELLANEOUS) ×3 IMPLANT
COVER TIP SHEARS 8 DVNC (MISCELLANEOUS) IMPLANT
COVER TIP SHEARS 8MM DA VINCI (MISCELLANEOUS)
DECANTER SPIKE VIAL GLASS SM (MISCELLANEOUS) ×6 IMPLANT
DEFOGGER SCOPE WARMER CLEARIFY (MISCELLANEOUS) ×3 IMPLANT
DERMABOND ADVANCED (GAUZE/BANDAGES/DRESSINGS) ×1
DERMABOND ADVANCED .7 DNX12 (GAUZE/BANDAGES/DRESSINGS) ×2 IMPLANT
DRAPE ARM DVNC X/XI (DISPOSABLE) ×8 IMPLANT
DRAPE C-ARM XRAY 36X54 (DRAPES) IMPLANT
DRAPE COLUMN DVNC XI (DISPOSABLE) ×2 IMPLANT
DRAPE DA VINCI XI ARM (DISPOSABLE) ×12
DRAPE DA VINCI XI COLUMN (DISPOSABLE) ×3
DRSG TEGADERM 2-3/8X2-3/4 SM (GAUZE/BANDAGES/DRESSINGS) ×2 IMPLANT
ELECT CAUTERY BLADE 6.4 (BLADE) ×3 IMPLANT
ELECT REM PT RETURN 9FT ADLT (ELECTROSURGICAL) ×3
ELECTRODE REM PT RTRN 9FT ADLT (ELECTROSURGICAL) ×2 IMPLANT
GAUZE 4X4 16PLY ~~LOC~~+RFID DBL (SPONGE) ×3 IMPLANT
GLOVE SURG SYN 6.5 ES PF (GLOVE) ×6 IMPLANT
GLOVE SURG SYN 6.5 PF PI (GLOVE) ×4 IMPLANT
GLOVE SURG UNDER POLY LF SZ7 (GLOVE) ×6 IMPLANT
GOWN STRL REUS W/ TWL LRG LVL3 (GOWN DISPOSABLE) ×6 IMPLANT
GOWN STRL REUS W/TWL LRG LVL3 (GOWN DISPOSABLE) ×9
GRASPER SUT TROCAR 14GX15 (MISCELLANEOUS) IMPLANT
IRRIGATOR SUCT 8 DISP DVNC XI (IRRIGATION / IRRIGATOR) IMPLANT
IRRIGATOR SUCTION 8MM XI DISP (IRRIGATION / IRRIGATOR)
IV NS 1000ML (IV SOLUTION)
IV NS 1000ML BAXH (IV SOLUTION) IMPLANT
LABEL OR SOLS (LABEL) ×3 IMPLANT
MANIFOLD NEPTUNE II (INSTRUMENTS) ×3 IMPLANT
NDL INSUFFLATION 14GA 120MM (NEEDLE) ×2 IMPLANT
NEEDLE HYPO 22GX1.5 SAFETY (NEEDLE) ×3 IMPLANT
NEEDLE INSUFFLATION 14GA 120MM (NEEDLE) ×3 IMPLANT
NS IRRIG 500ML POUR BTL (IV SOLUTION) ×3 IMPLANT
OBTURATOR OPTICAL STANDARD 8MM (TROCAR) ×3
OBTURATOR OPTICAL STND 8 DVNC (TROCAR) ×2
OBTURATOR OPTICALSTD 8 DVNC (TROCAR) ×2 IMPLANT
PACK LAP CHOLECYSTECTOMY (MISCELLANEOUS) ×3 IMPLANT
PENCIL ELECTRO HAND CTR (MISCELLANEOUS) ×3 IMPLANT
SEAL CANN UNIV 5-8 DVNC XI (MISCELLANEOUS) ×6 IMPLANT
SEAL XI 5MM-8MM UNIVERSAL (MISCELLANEOUS) ×9
SET TUBE SMOKE EVAC HIGH FLOW (TUBING) ×3 IMPLANT
SOLUTION ELECTROLUBE (MISCELLANEOUS) ×3 IMPLANT
SPONGE GAUZE 2X2 8PLY STRL LF (GAUZE/BANDAGES/DRESSINGS) ×2 IMPLANT
STAPLER CANNULA SEAL DVNC XI (STAPLE) ×2 IMPLANT
STAPLER CANNULA SEAL XI (STAPLE) ×3
STRIP CLOSURE SKIN 1/2X4 (GAUZE/BANDAGES/DRESSINGS) ×1 IMPLANT
SUT MNCRL 4-0 (SUTURE) ×6
SUT MNCRL 4-0 27XMFL (SUTURE) ×4
SUT VICRYL 0 AB UR-6 (SUTURE) ×3 IMPLANT
SUTURE MNCRL 4-0 27XMF (SUTURE) ×4 IMPLANT
SYR 30ML LL (SYRINGE) IMPLANT
WATER STERILE IRR 500ML POUR (IV SOLUTION) ×3 IMPLANT

## 2021-04-30 NOTE — Transfer of Care (Signed)
Immediate Anesthesia Transfer of Care Note  Patient: Andrew Porter  Procedure(s) Performed: XI ROBOTIC ASSISTED LAPAROSCOPIC CHOLECYSTECTOMY (Abdomen) INDOCYANINE GREEN FLUORESCENCE IMAGING (ICG)  Patient Location: PACU  Anesthesia Type:General  Level of Consciousness: sedated  Airway & Oxygen Therapy: Patient Spontanous Breathing  Post-op Assessment: Report given to RN  Post vital signs: Reviewed and stable  Last Vitals:  Vitals Value Taken Time  BP 145/81 04/30/21 0849  Temp    Pulse 67 04/30/21 0852  Resp 12 04/30/21 0852  SpO2 100 % 04/30/21 0852  Vitals shown include unvalidated device data.  Last Pain:  Vitals:   04/30/21 0629  TempSrc: Tympanic  PainSc: 0-No pain         Complications: No notable events documented.

## 2021-04-30 NOTE — Anesthesia Procedure Notes (Signed)
Procedure Name: Intubation Date/Time: 04/30/2021 7:41 AM Performed by: Hezzie Bump, CRNA Pre-anesthesia Checklist: Patient identified, Patient being monitored, Timeout performed, Emergency Drugs available and Suction available Patient Re-evaluated:Patient Re-evaluated prior to induction Oxygen Delivery Method: Circle system utilized Preoxygenation: Pre-oxygenation with 100% oxygen Induction Type: IV induction Ventilation: Mask ventilation without difficulty Laryngoscope Size: McGraph and 4 Grade View: Grade I Tube type: Oral Tube size: 7.5 mm Number of attempts: 1 Airway Equipment and Method: Stylet and Video-laryngoscopy Placement Confirmation: ETT inserted through vocal cords under direct vision, positive ETCO2 and breath sounds checked- equal and bilateral Secured at: 21 cm Tube secured with: Tape Dental Injury: Teeth and Oropharynx as per pre-operative assessment

## 2021-04-30 NOTE — Anesthesia Preprocedure Evaluation (Signed)
Anesthesia Evaluation  Patient identified by MRN, date of birth, ID band Patient awake    Reviewed: Allergy & Precautions, NPO status , Patient's Chart, lab work & pertinent test results  History of Anesthesia Complications Negative for: history of anesthetic complications  Airway Mallampati: II  TM Distance: >3 FB Neck ROM: Full    Dental no notable dental hx.    Pulmonary neg pulmonary ROS, neg sleep apnea, neg COPD,    breath sounds clear to auscultation- rhonchi (-) wheezing      Cardiovascular Exercise Tolerance: Good (-) hypertension+CHF  (-) CAD, (-) Past MI, (-) Cardiac Stents and (-) CABG + Valvular Problems/Murmurs (s/p MV repair)  Rhythm:Regular Rate:Normal - Systolic murmurs and - Diastolic murmurs CTA 02/10/21: 1. Normal origin and course of coronary arteries.  2. Diffuse soft plaque throughout the RCA with mild multifocal stenosis  with up to 30% stenosis.  3. Mild to moderate calcifications in the proximal aspect of the LAD with  30-50% stenosis (CAD-RADS 2).  4. Mild luminal calcifications as well as scattered soft plaque in the  mid/distal aspect of the left main coronary artery with less than 30%  stenosis.   Echo 10/01/18: MILDLV DYSFUNCTION (EF 50%) WITH MILD LVH  NORMAL RIGHT VENTRICULAR SYSTOLIC FUNCTION  VALVULAR REGURGITATION: TRIVIAL MR, MILD PR, TRIVIAL TR  PROSTHETIC VALVE(S): PROSTHETIC MV RING  Excellent long term mitral valve repair result performed 11/21/2006.     Neuro/Psych  Headaches, neg Seizures negative psych ROS   GI/Hepatic negative GI ROS, Neg liver ROS,   Endo/Other  negative endocrine ROSneg diabetes  Renal/GU negative Renal ROS     Musculoskeletal negative musculoskeletal ROS (+)   Abdominal (+) - obese,   Peds  Hematology negative hematology ROS (+)   Anesthesia Other Findings Past Medical History: No date: B12 deficiency No date: BPH (benign prostatic  hyperplasia) No date: CAD (coronary artery disease)     Comment:  a.) FFRCT 02/10/2021 --> 30-50% pLAD, 30% dLM, 30% pD1,               30% mRCA. FFR VALUES --> 0.96 pLAD, 0.91 mLAD, 0.84 dLAD;              0.87 dLCx; 9.94 mRCA, 0.93 dRCA. No date: CHF (congestive heart failure) (HCC)     Comment:  a.) TTE 10/01/2018 --> mild LV systolic dysfunction with              mild LVH; LVEF 50%. LV GLS -15.2%. No date: Complication of anesthesia     Comment:  a.) delayed emergence. b.) intraoperative bradycardia No date: Exertional chest pain 07/2008: Large cisterna magna (HCC)     Comment:  a.) Tx'd with suboccipital craniotomy for fenestration               and drainage; performed in [place]. 08/02/2003: Lyme disease No date: Migraine with aura No date: Mitral stenosis     Comment:  a.) s/p  MVR (32 mm ring with artificial cords) via               RIGHT thoracotomy approach on 11/21/2006 No date: MVP (mitral valve prolapse) No date: Pneumonia No date: Pre-diabetes No date: Sigmoid diverticulosis No date: Sleep disturbance No date: Valvular regurgitation     Comment:  a.) TTE 10/01/2018 --> LVEF 50%; trivial MR, TR; mild               PR. No date: Vitamin D deficiency   Reproductive/Obstetrics  Anesthesia Physical Anesthesia Plan  ASA: 3  Anesthesia Plan: General   Post-op Pain Management:    Induction: Intravenous  PONV Risk Score and Plan: 1 and Ondansetron and Dexamethasone  Airway Management Planned: Oral ETT  Additional Equipment:   Intra-op Plan:   Post-operative Plan: Extubation in OR  Informed Consent: I have reviewed the patients History and Physical, chart, labs and discussed the procedure including the risks, benefits and alternatives for the proposed anesthesia with the patient or authorized representative who has indicated his/her understanding and acceptance.     Dental advisory given  Plan Discussed  with: CRNA and Anesthesiologist  Anesthesia Plan Comments:         Anesthesia Quick Evaluation

## 2021-04-30 NOTE — Interval H&P Note (Signed)
No change ok to proceed

## 2021-04-30 NOTE — Op Note (Signed)
Preoperative diagnosis:  chronic and cholecystitis  Postoperative diagnosis: same as above  Procedure: Robotic assisted Laparoscopic Cholecystectomy.   Anesthesia: GETA   Surgeon: Arvine Clayburn  Specimen: Gallbladder  Complications: None  EBL: 15mL  Wound Classification: Clean Contaminated  Indications: see HPI  Findings: Critical view of safety noted Cystic duct and artery identified, ligated and divided, clips remained intact at end of procedure Adequate hemostasis  Description of procedure:  The patient was placed on the operating table in the supine position. SCDs placed, pre-op abx administered.  General anesthesia was induced and OG tube placed by anesthesia. A time-out was completed verifying correct patient, procedure, site, positioning, and implant(s) and/or special equipment prior to beginning this procedure. The abdomen was prepped and draped in the usual sterile fashion.    Veress needle was placed at the Palmer's point and insufflation was started after confirming a positive saline drop test and no immediate increase in abdominal pressure.  After reaching 15 mm, the Veress needle was removed and a 8 mm port was placed via optiview technique under umbilicus measured 20mm from gallbladder.  The abdomen was inspected and no abnormalities or injuries were found.  Under direct vision, ports were placed in the following locations: One 12 mm patient left of the umbilicus, 8cm from the optiviewed port, one 8 mm port placed to the patient right of the umbilical port 8 cm apart.  1 additional 8 mm port placed lateral to the 12mm port.  Once ports were placed, The table was placed in the reverse Trendelenburg position with the right side up. The Xi platform was brought into the operative field and docked to the ports successfully.  An endoscope was placed through the umbilical port, fenestrated grasper through the adjacent patient right port, prograsp to the far patient left port, and  then a hook cautery in the left port.  The dome of the gallbladder was grasped with prograsp, passed and retracted over the dome of the liver. Adhesions between the gallbladder and omentum, duodenum and transverse colon were lysed via hook cautery. The infundibulum was grasped with the fenestrated grasper and retracted toward the right lower quadrant. This maneuver exposed Calot's triangle. The peritoneum overlying the gallbladder infundibulum was then dissected  and the cystic duct and cystic artery identified.  Critical view of safety with the liver bed clearly visible behind the duct and artery with no additional structures noted.  The cystic duct and cystic artery clipped and divided close to the gallbladder.     The gallbladder was then dissected from its peritoneal and liver bed attachments by electrocautery. Hemostasis was checked prior to removing the hook cautery and the Endo Catch bag was then placed through the 12 mm port and the gallbladder was removed.  The gallbladder was passed off the table as a specimen. There was no evidence of bleeding from the gallbladder fossa or cystic artery or leakage of the bile from the cystic duct stump. The 12 mm port site closed with PMI using 0 vicryl under direct vision.  Abdomen desufflated and secondary trocars were removed under direct vision. No bleeding was noted. All skin incisions then closed with subcuticular sutures of 4-0 monocryl and dressed with topical skin adhesive. The orogastric tube was removed and patient extubated.  The patient tolerated the procedure well and was taken to the postanesthesia care unit in stable condition.  All sponge and instrument count correct at end of procedure.  

## 2021-04-30 NOTE — Discharge Instructions (Addendum)
Laparoscopic Cholecystectomy, Care After This sheet gives you information about how to care for yourself after your procedure. Your doctor may also give you more specific instructions. If you have problems or questions, contact your doctor. Follow these instructions at home: Care for cuts from surgery (incisions)  Follow instructions from your doctor about how to take care of your cuts from surgery. Make sure you: Wash your hands with soap and water before you change your bandage (dressing). If you cannot use soap and water, use hand sanitizer. Change your bandage as told by your doctor. Leave stitches (sutures), skin glue, or skin tape (adhesive) strips in place. They may need to stay in place for 2 weeks or longer. If tape strips get loose and curl up, you may trim the loose edges. Do not remove tape strips completely unless your doctor says it is okay. Do not take baths, swim, or use a hot tub until your doctor says it is okay. OK TO REMOVE OUTER DRESSINGS AND SHOWER, 24HRS AFTER YOUR SURGERY. PLEASE KEEP THE STERISTRIPS INTACT UNTIL THEY FALL OFF ON THEIR OWN  Check your surgical cut area every day for signs of infection. Check for: More redness, swelling, or pain. More fluid or blood. Warmth. Pus or a bad smell. Activity Do not drive or use heavy machinery while taking prescription pain medicine. Do not play contact sports until your doctor says it is okay. Do not drive for 24 hours if you were given a medicine to help you relax (sedative). Rest as needed. Do not return to work or school until your doctor says it is okay. General instructions RESUME ASPIRIN IN 48HRS  tylenol and advil as needed for discomfort.  Please alternate between the two every four hours as needed for pain.    Use narcotics, if prescribed, only when tylenol and motrin is not enough to control pain.  325-650mg  every 8hrs to max of 3000mg /24hrs (including the 325mg  in every norco dose) for the tylenol.    Advil up  to 800mg  per dose every 8hrs as needed for pain.   To prevent or treat constipation while you are taking prescription pain medicine, your doctor may recommend that you: Drink enough fluid to keep your pee (urine) clear or pale yellow. Take over-the-counter or prescription medicines. Eat foods that are high in fiber, such as fresh fruits and vegetables, whole grains, and beans. Limit foods that are high in fat and processed sugars, such as fried and sweet foods. Contact a doctor if: You develop a rash. You have more redness, swelling, or pain around your surgical cuts. You have more fluid or blood coming from your surgical cuts. Your surgical cuts feel warm to the touch. You have pus or a bad smell coming from your surgical cuts. You have a fever. One or more of your surgical cuts breaks open. Get help right away if: You have trouble breathing. You have chest pain. You have pain that is getting worse in your shoulders. You faint or feel dizzy when you stand. You have very bad pain in your belly (abdomen). You are sick to your stomach (nauseous) for more than one day. You have throwing up (vomiting) that lasts for more than one day. You have leg pain. This information is not intended to replace advice given to you by your health care provider. Make sure you discuss any questions you have with your health care provider. Document Released: 04/26/2008 Document Revised: 02/06/2016 Document Reviewed: 01/04/2016 Elsevier Interactive Patient Education  2019  [employer]  AMBULATORY SURGERY  DISCHARGE INSTRUCTIONS   The drugs that you were given will stay in your system until tomorrow so for the next 24 hours you should not:  Drive an automobile Make any legal decisions Drink any alcoholic beverage   You may resume regular meals tomorrow.  Today it is better to start with liquids and gradually work up to solid foods.  You may eat anything you prefer, but it is better to start with  liquids, then soup and crackers, and gradually work up to solid foods.   Please notify your doctor immediately if you have any unusual bleeding, trouble breathing, redness and pain at the surgery site, drainage, fever, or pain not relieved by medication.    Additional Instructions:     Please contact your physician with any problems or Same Day Surgery at [phone], Monday through Friday 6 am to 4 pm, or [place] at [facility] number at [phone].

## 2021-04-30 NOTE — Anesthesia Postprocedure Evaluation (Signed)
Anesthesia Post Note  Patient: Andrew Porter  Procedure(s) Performed: XI ROBOTIC ASSISTED LAPAROSCOPIC CHOLECYSTECTOMY (Abdomen) INDOCYANINE GREEN FLUORESCENCE IMAGING (ICG)  Patient location during evaluation: PACU Anesthesia Type: General Level of consciousness: awake and alert and oriented Pain management: pain level controlled Vital Signs Assessment: post-procedure vital signs reviewed and stable Respiratory status: spontaneous breathing, nonlabored ventilation and respiratory function stable Cardiovascular status: blood pressure returned to baseline and stable Postop Assessment: no signs of nausea or vomiting Anesthetic complications: no   No notable events documented.   Last Vitals:  Vitals:   04/30/21 0943 04/30/21 1106  BP: 115/76 121/78  Pulse: (!) 57 (!) 57  Resp: 16 15  Temp: (!) 36.1 C   SpO2: 100% 100%    Last Pain:  Vitals:   04/30/21 1106  TempSrc:   PainSc: 4                  Fujie Dickison

## 2021-05-03 LAB — SURGICAL PATHOLOGY

## 2021-05-13 ENCOUNTER — Encounter: Payer: 59 | Admitting: Internal Medicine

## 2021-05-20 ENCOUNTER — Ambulatory Visit (INDEPENDENT_AMBULATORY_CARE_PROVIDER_SITE_OTHER): Payer: 59 | Admitting: Internal Medicine

## 2021-05-20 ENCOUNTER — Encounter: Payer: Self-pay | Admitting: Internal Medicine

## 2021-05-20 ENCOUNTER — Other Ambulatory Visit: Payer: Self-pay

## 2021-05-20 VITALS — BP 112/70 | HR 87 | Temp 98.2°F | Ht 70.0 in | Wt 174.0 lb

## 2021-05-20 DIAGNOSIS — L659 Nonscarring hair loss, unspecified: Secondary | ICD-10-CM | POA: Diagnosis not present

## 2021-05-20 DIAGNOSIS — L219 Seborrheic dermatitis, unspecified: Secondary | ICD-10-CM | POA: Diagnosis not present

## 2021-05-20 DIAGNOSIS — Z0000 Encounter for general adult medical examination without abnormal findings: Secondary | ICD-10-CM | POA: Diagnosis not present

## 2021-05-20 DIAGNOSIS — Z23 Encounter for immunization: Secondary | ICD-10-CM | POA: Diagnosis not present

## 2021-05-20 MED ORDER — HYDROCORTISONE 2.5 % EX CREA: TOPICAL_CREAM | Freq: Three times a day (TID) | CUTANEOUS | 3 refills | Status: DC | PRN

## 2021-05-20 MED ORDER — KETOCONAZOLE 2 % EX SHAM
1.0000 | MEDICATED_SHAMPOO | CUTANEOUS | 11 refills | Status: DC
Start: 2021-05-20 — End: 2022-10-12

## 2021-05-20 NOTE — Addendum Note (Signed)
Addended by: Eual Fines on: 05/20/2021 04:20 PM   Modules accepted: Orders

## 2021-05-20 NOTE — Progress Notes (Signed)
Subjective:    Patient ID: Andrew Porter, male    DOB: [birth year], 60 y.o.   MRN: [id number]  HPI Here for physical This visit occurred during the SARS-CoV-2 public health emergency.  Safety protocols were in place, including screening questions prior to the visit, additional usage of staff PPE, and extensive cleaning of exam room while observing appropriate contact time as indicated for disinfecting solutions.   Overall doing well Still has significant exercise intolerance in the heat---hard to play tennis Rare vision disturbances No balance issues  He does note occasionally if eating--especially if cake or too much--will feel very tired  30-45 minutes later and have to lie down. Will sleep for a couple of hours. Awakens and has to pee a lot--then back to normal Sporadic--may go a month without-then twice in 1 week  Has some sores on his scalp If he misses shampooing for one day--will get sore Similar to in eyebrows and by sides of nose  Current Outpatient Medications on File Prior to Visit  Medication Sig Dispense Refill   aspirin 325 MG tablet Take 650 mg by mouth daily.     Cholecalciferol (DIALYVITE VITAMIN D 5000 PO) Take 5,000 Units by mouth daily.     Cyanocobalamin (B-12) 5000 MCG SUBL Place 5,000 mcg under the tongue daily.     finasteride (PROSCAR) 5 MG tablet Take 1 tablet (5 mg total) by mouth daily. (Patient taking differently: Take 2.5 mg by mouth every other day.) 90 tablet 0   loratadine (CLARITIN) 10 MG tablet Take 10 mg by mouth daily.     Magnesium 500 MG TABS Take 500 mg by mouth daily.     Multiple Vitamin (MULTIVITAMIN) capsule Take 1 capsule by mouth daily.      VITAMIN K PO Take 200 mg by mouth daily.     No current facility-administered medications on file prior to visit.    Allergies  Allergen Reactions   Levofloxacin Other (See Comments)    Nightmares,muscle aches    Quinolones     Nightmare and muscle aches, pt prefers not to use this  class of antibiotics     Past Medical History:  Diagnosis Date   B12 deficiency    BPH (benign prostatic hyperplasia)    CAD (coronary artery disease)    a.) FFRCT 02/10/2021 --> 30-50% pLAD, 30% dLM, 30% pD1, 30% mRCA. FFR VALUES --> 0.96 pLAD, 0.91 mLAD, 0.84 dLAD; 0.87 dLCx; 9.94 mRCA, 0.93 dRCA.   CHF (congestive heart failure) (HCC)    a.) TTE 10/01/2018 --> mild LV systolic dysfunction with mild LVH; LVEF 50%. LV GLS -15.2%.   Complication of anesthesia    a.) delayed emergence. b.) intraoperative bradycardia   Exertional chest pain    Large cisterna magna (HCC) 07/2008   a.) Tx'd with suboccipital craniotomy for fenestration and drainage; performed in [place].   Lyme disease 08/02/2003   Migraine with aura    Mitral stenosis    a.) s/p  MVR (32 mm ring with artificial cords) via RIGHT thoracotomy approach on 11/21/2006   MVP (mitral valve prolapse)    Pneumonia    Pre-diabetes    Sigmoid diverticulosis    Sleep disturbance    Valvular regurgitation    a.) TTE 10/01/2018 --> LVEF 50%; trivial MR, TR; mild PR.   Vitamin D deficiency     Past Surgical History:  Procedure Laterality Date   CRANIOTOMY FOR CYST FENESTRATION N/A 07/08/2008   Procedure: SUBOCCIPITAL CRANIOTOMY, C1 LAMINECTOMY, DURAPLSTY  FOR ARACHNOID CYST FENESTRATION AND DRAINAGE OF LARGE CISTERNA MAGNA. Location: [facility]; Surgeon: Doretha Sou, MD.   INGUINAL HERNIA REPAIR Right 2000   Procedure: RIGHT INGUINAL HERNIA REPAIR; Location: [facility]; Surgeon: Donnalee Curry, MD   MITRAL VALVE REPAIR N/A 11/21/2006   Procedure: MITRAL VALVE REPAIR (32 mm ring with artificial cords); Location: [facility]; Surgeon: Judd Gaudier, MD   THORACOTOMY Right 11/21/2006   Procedure: RIGHT THORACOTOMY FOR MITRAL VALVE REPAIR; Location: [facility]; Surgeon: Judd Gaudier, MD    Family History  Problem Relation Age of Onset   Coronary artery disease Father    Coronary artery disease Paternal Grandfather     Diabetes Neg Hx    Hypertension Neg Hx    Cancer Neg Hx     Social History   Socioeconomic History   Marital status: Married    Spouse name: Not on file   Number of children: 3   Years of education: Not on file   Highest education level: Not on file  Occupational History   Occupation: Civil engineer, [employer]  Tobacco Use   Smoking status: Never   Smokeless tobacco: Never  Vaping Use   Vaping Use: Never used  Substance and Sexual Activity   Alcohol use: Yes    Comment: rarely   Drug use: No   Sexual activity: Not on file  Other Topics Concern   Not on file  Social History Narrative   Not on file   Social Determinants of Health   Financial Resource Strain: Not on file  Food Insecurity: Not on file  Transportation Needs: Not on file  Physical Activity: Not on file  Stress: Not on file  Social Connections: Not on file  Intimate Partner Violence: Not on file   Review of Systems  Constitutional:  Negative for fatigue and unexpected weight change.       Wears seat belt Plays tennis 3 times a week  HENT:  Negative for dental problem, hearing loss and tinnitus.        Keeps up with dentist  Eyes:        Does have some spells where vision is off with glasses--then it improves again  Respiratory:  Negative for cough, chest tightness and shortness of breath.   Cardiovascular:  Negative for chest pain, palpitations and leg swelling.  Gastrointestinal:  Negative for blood in stool.       Variable bowels since cholecystectomy No heartburn  Endocrine: Negative for polydipsia and polyuria.  Genitourinary:  Negative for difficulty urinating and urgency.       No sexual problems  Musculoskeletal:  Negative for arthralgias, back pain and joint swelling.  Allergic/Immunologic: Negative for environmental allergies and immunocompromised state.  Neurological:  Negative for dizziness, syncope, light-headedness and headaches.  Hematological:  Negative for adenopathy.  Does not bruise/bleed easily.  Psychiatric/Behavioral:  Negative for dysphoric mood and sleep disturbance. The patient is not nervous/anxious.       Objective:   Physical Exam Constitutional:      Appearance: Normal appearance.  HENT:     Right Ear: Tympanic membrane and ear canal normal.     Left Ear: Tympanic membrane and ear canal normal.     Mouth/Throat:     Pharynx: No oropharyngeal exudate or posterior oropharyngeal erythema.  Eyes:     Conjunctiva/sclera: Conjunctivae normal.     Pupils: Pupils are equal, round, and reactive to light.  Cardiovascular:     Rate and Rhythm: Normal rate and regular rhythm.  Pulses: Normal pulses.     Heart sounds: No murmur heard.   No gallop.  Pulmonary:     Effort: Pulmonary effort is normal.     Breath sounds: Normal breath sounds. No wheezing or rales.  Abdominal:     Palpations: Abdomen is soft.     Tenderness: There is no abdominal tenderness.  Musculoskeletal:     Cervical back: Neck supple.     Left lower leg: No edema.  Lymphadenopathy:     Cervical: No cervical adenopathy.  Skin:    Comments: Seborrhea face and scalp  Neurological:     General: No focal deficit present.     Mental Status: He is alert and oriented to person, place, and time.  Psychiatric:        Mood and Affect: Mood normal.        Behavior: Behavior normal.           Assessment & Plan:

## 2021-05-20 NOTE — Assessment & Plan Note (Signed)
Will continue the finasteride

## 2021-05-20 NOTE — Assessment & Plan Note (Signed)
Will Rx cream and shampoo Can send lotion if trouble with the cream in his scalp

## 2021-05-20 NOTE — Assessment & Plan Note (Signed)
Healthy Stays fit Flu vaccine today--and shingrix Colon done last November---next in 10 years Defer PSA to next year Prefers not to have COVID booster

## 2021-08-27 ENCOUNTER — Telehealth: Payer: Self-pay

## 2021-08-27 MED ORDER — FINASTERIDE 5 MG PO TABS: 2.5000 mg | ORAL_TABLET | ORAL | 3 refills | Status: DC

## 2021-08-27 NOTE — Telephone Encounter (Signed)
ERX

## 2022-05-02 ENCOUNTER — Emergency Department
Admission: EM | Admit: 2022-05-02 | Discharge: 2022-05-03 | Disposition: A | Discharge status: Home / Self Care | Payer: 59 | Attending: Emergency Medicine | Admitting: Emergency Medicine

## 2022-05-02 ENCOUNTER — Emergency Department: Payer: 59

## 2022-05-02 ENCOUNTER — Other Ambulatory Visit: Payer: Self-pay

## 2022-05-02 DIAGNOSIS — R531 Weakness: Secondary | ICD-10-CM | POA: Diagnosis present

## 2022-05-02 DIAGNOSIS — I509 Heart failure, unspecified: Secondary | ICD-10-CM | POA: Diagnosis not present

## 2022-05-02 DIAGNOSIS — R079 Chest pain, unspecified: Secondary | ICD-10-CM | POA: Diagnosis not present

## 2022-05-02 DIAGNOSIS — I2510 Atherosclerotic heart disease of native coronary artery without angina pectoris: Secondary | ICD-10-CM | POA: Insufficient documentation

## 2022-05-02 DIAGNOSIS — M546 Pain in thoracic spine: Secondary | ICD-10-CM | POA: Insufficient documentation

## 2022-05-02 LAB — CBC
HCT: 44.1 % (ref 39.0–52.0)
Hemoglobin: 14.8 g/dL (ref 13.0–17.0)
MCH: 32.6 pg (ref 26.0–34.0)
MCHC: 33.6 g/dL (ref 30.0–36.0)
MCV: 97.1 fL (ref 80.0–100.0)
Platelets: 173 10*3/uL (ref 150–400)
RBC: 4.54 MIL/uL (ref 4.22–5.81)
RDW: 12.5 % (ref 11.5–15.5)
WBC: 8.8 10*3/uL (ref 4.0–10.5)
nRBC: 0 % (ref 0.0–0.2)

## 2022-05-02 LAB — BASIC METABOLIC PANEL
Anion gap: 8 (ref 5–15)
BUN: 24 mg/dL — ABNORMAL HIGH (ref 6–20)
CO2: 24 mmol/L (ref 22–32)
Calcium: 9 mg/dL (ref 8.9–10.3)
Chloride: 106 mmol/L (ref 98–111)
Creatinine, Ser: 1.3 mg/dL — ABNORMAL HIGH (ref 0.61–1.24)
GFR, Estimated: >60 mL/min (ref >60)
Glucose, Bld: 85 mg/dL (ref 70–99)
Potassium: 3.8 mmol/L (ref 3.5–5.1)
Sodium: 138 mmol/L (ref 135–145)

## 2022-05-02 LAB — TROPONIN I (HIGH SENSITIVITY): Troponin I (High Sensitivity): 4 ng/L (ref <18)

## 2022-05-02 NOTE — ED Triage Notes (Signed)
Pt presents via POV c/o weakness and chest pain starting today. Reports woke up this am feeling weak. Went and played Tennis and has been feeling progreessingly worse. Reports chest burning sensation x1 hr. Reports hx of mitral valve repair. A&O x4.

## 2022-05-03 ENCOUNTER — Emergency Department: Payer: 59

## 2022-05-03 LAB — TROPONIN I (HIGH SENSITIVITY): Troponin I (High Sensitivity): 5 ng/L (ref <18)

## 2022-05-03 MED ORDER — IOHEXOL 300 MG/ML  SOLN
75.0000 mL | Freq: Once | INTRAMUSCULAR | Status: AC | PRN
  Administered 2022-05-03: 75 mL via INTRAVENOUS

## 2022-05-03 NOTE — ED Notes (Signed)
Pt reports chest pain earlier tonight.  No pain now and states he is ready to go.  Md aware. No sob. No n/v  pt alert  speech clear.

## 2022-05-03 NOTE — Discharge Instructions (Addendum)
Your EKG and cardiac enzymes were reassuring.  If you have recurrent chest pain that is not resolving please return to the emergency department.  Otherwise please follow-up with your primary doctor.

## 2022-05-03 NOTE — ED Provider Notes (Signed)
[facility] Provider Note    Event Date/Time   First MD Initiated Contact with Patient 05/02/22 2344     (approximate)   History   Chest Pain and Weakness   HPI  Andrew Porter is a 61 y.o. male past medical history of CHF, coronary artery disease, mitral stenosis status mitral valve repair who presents with chest pain and weakness.  Symptoms started around 8:30 PM after he played tennis.  Felt okay during tennis but afterward felt extremely weak fatigue that he could barely walk.  For like his left arm had "no blood flow denies numbness or weakness just heaviness.  Then felt pressure-like sensation in his chest.  Lasted for several hours.  Denies associated nausea or diaphoresis did feel somewhat short of breath.  He now just complains of some pain in his upper back but his chest pain is gone and he no longer feels as weak.  Has had similar episodes in the past but has never had a heart attack.  Does have history of mitral valve repair he is not on any blood thinners.  Denies history of DVT or PE.     Past Medical History:  Diagnosis Date   B12 deficiency    BPH (benign prostatic hyperplasia)    CAD (coronary artery disease)    a.) FFRCT 02/10/2021 --> 30-50% pLAD, 30% dLM, 30% pD1, 30% mRCA. FFR VALUES --> 0.96 pLAD, 0.91 mLAD, 0.84 dLAD; 0.87 dLCx; 9.94 mRCA, 0.93 dRCA.   CHF (congestive heart failure) ([place])    a.) TTE 10/01/2018 --> mild LV systolic dysfunction with mild LVH; LVEF 50%. LV GLS -15.2%.   Complication of anesthesia    a.) delayed emergence. b.) intraoperative bradycardia   Exertional chest pain    Large cisterna magna ([place]) 07/2008   a.) Tx'd with suboccipital craniotomy for fenestration and drainage; performed in [place].   Lyme disease 08/02/2003   Migraine with aura    Mitral stenosis    a.) s/p  MVR (32 mm ring with artificial cords) via RIGHT thoracotomy approach on 11/21/2006   MVP (mitral valve prolapse)    Pneumonia     Pre-diabetes    Sigmoid diverticulosis    Sleep disturbance    Valvular regurgitation    a.) TTE 10/01/2018 --> LVEF 50%; trivial MR, TR; mild PR.   Vitamin D deficiency     Patient Active Problem List   Diagnosis Date Noted   Seborrhea 05/20/2021   BPH (benign prostatic hyperplasia) 01/03/2018   Routine general medical examination at a health care facility 05/22/2012   Migraine with aura    Alopecia 10/08/2009     Physical Exam  Triage Vital Signs: ED Triage Vitals  Enc Vitals Group     BP 05/02/22 2127 118/79     Pulse Rate 05/02/22 2127 71     Resp 05/02/22 2127 14     Temp 05/02/22 2127 98.4 F (36.9 C)     Temp Source 05/02/22 2127 Oral     SpO2 05/02/22 2127 99 %     Weight 05/02/22 2128 180 lb (81.6 kg)     Height 05/02/22 2128 5\' 10"  (1.778 m)     Head Circumference --      Peak Flow --      Pain Score 05/02/22 2128 5     Pain Loc --      Pain Edu? --      Excl. in [place]? --     Most  recent vital signs: Vitals:   05/03/22 0130 05/03/22 0133  BP: 124/84   Pulse: 69   Resp: (!) 22   Temp:  98.5 F (36.9 C)  SpO2: 99%      General: Awake, no distress.  CV:  Good peripheral perfusion.  2+ radial pulses bilaterally Resp:  Normal effort.  Lungs are clear Abd:  No distention.  Abdomen soft nontender Neuro:             Awake, Alert, Oriented x 3  Other:     ED Results / Procedures / Treatments  Labs (all labs ordered are listed, but only abnormal results are displayed) Labs Reviewed  BASIC METABOLIC PANEL - Abnormal; Notable for the following components:      Result Value   BUN 24 (*)    Creatinine, Ser 1.30 (*)    All other components within normal limits  CBC  TROPONIN I (HIGH SENSITIVITY)  TROPONIN I (HIGH SENSITIVITY)     EKG  EKG interpretation performed by myself: NSR, nml axis, nml intervals, no acute ischemic changes    RADIOLOGY Chest x-ray PA and lateral interpreted by myself shows a right-sided  nodule   PROCEDURES:  Critical Care performed: No  .1-3 Lead EKG Interpretation  Performed by: Rada Hay, MD Authorized by: Rada Hay, MD     Interpretation: normal     ECG rate assessment: normal     Rhythm: sinus rhythm     Ectopy: none     Conduction: normal     The patient is on the cardiac monitor to evaluate for evidence of arrhythmia and/or significant heart rate changes.   MEDICATIONS ORDERED IN ED: Medications  iohexol (OMNIPAQUE) 300 MG/ML solution 75 mL (75 mLs Intravenous Contrast Given 05/03/22 0138)     IMPRESSION / MDM / ASSESSMENT AND PLAN / ED COURSE  I reviewed the triage vital signs and the nursing notes.                              Patient's presentation is most consistent with acute presentation with potential threat to life or bodily function.  Differential diagnosis includes, but is not limited to, ACS, pulmonary embolism, hypoglycemia, dehydration, AKI, electrolyte abnormality  The patient is a 61 year old male who presents with chest pain and global weakness.  This started this evening after he played tennis.  Did not have any issues while he was actually playing tennis but shortly after felt extremely fatigued and then started having some left arm heaviness and chest pressure.  Chest pressure is now resolved he just had some mild pain in the mid upper back.  Denies any shortness of breath.  Vital signs are reassuring.  EKG is nonischemic first troponin is negative.  Patient is feeling well and asked to be discharged however given his symptoms do feel that he needs a second troponin to rule out this being a cardiac event.  Explained to him my concern and he was willing to stay for second troponin.  Chest x-ray PA and lateral also shows a large right-sided nodule will obtain CT chest with contrast to further evaluate will likely need outpatient follow-up.  CT of the chest with contrast is normal.  Chest x-ray must of had artifact.   Repeat enzymes are negative.  Patient continues to feel well.  He is appropriate for discharge at this time.  We discussed return precautions.     FINAL CLINICAL  IMPRESSION(S) / ED DIAGNOSES   Final diagnoses:  Weakness  Chest pain, unspecified type     Rx / DC Orders   ED Discharge Orders     None        Note:  This document was prepared using Dragon voice recognition software and may include unintentional dictation errors.   Rada Hay, MD 05/03/22 [phone]

## 2022-10-12 ENCOUNTER — Encounter: Payer: Self-pay | Admitting: Internal Medicine

## 2022-10-12 ENCOUNTER — Ambulatory Visit: Payer: 59 | Admitting: Internal Medicine

## 2022-10-12 VITALS — BP 102/60 | HR 78 | Temp 97.5°F | Resp 12 | Ht 70.0 in | Wt 182.0 lb

## 2022-10-12 DIAGNOSIS — L219 Seborrheic dermatitis, unspecified: Secondary | ICD-10-CM

## 2022-10-12 DIAGNOSIS — N400 Enlarged prostate without lower urinary tract symptoms: Secondary | ICD-10-CM | POA: Diagnosis not present

## 2022-10-12 DIAGNOSIS — Z0000 Encounter for general adult medical examination without abnormal findings: Secondary | ICD-10-CM

## 2022-10-12 LAB — COMPREHENSIVE METABOLIC PANEL
ALT: 12 U/L (ref 0–53)
AST: 18 U/L (ref 0–37)
Albumin: 4.2 g/dL (ref 3.5–5.2)
Alkaline Phosphatase: 59 U/L (ref 39–117)
BUN: 19 mg/dL (ref 6–23)
CO2: 31 mEq/L (ref 19–32)
Calcium: 9.6 mg/dL (ref 8.4–10.5)
Chloride: 101 mEq/L (ref 96–112)
Creatinine, Ser: 1.26 mg/dL (ref 0.40–1.50)
GFR: 61.89 mL/min (ref >60.00)
Glucose, Bld: 100 mg/dL — ABNORMAL HIGH (ref 70–99)
Potassium: 4.3 mEq/L (ref 3.5–5.1)
Sodium: 138 mEq/L (ref 135–145)
Total Bilirubin: 0.7 mg/dL (ref 0.2–1.2)
Total Protein: 6.9 g/dL (ref 6.0–8.3)

## 2022-10-12 LAB — LIPID PANEL
Cholesterol: 235 mg/dL — ABNORMAL HIGH (ref 0–200)
HDL: 56.8 mg/dL (ref >39.00)
LDL Cholesterol: 157 mg/dL — ABNORMAL HIGH (ref 0–99)
NonHDL: 177.88
Total CHOL/HDL Ratio: 4
Triglycerides: 106 mg/dL (ref 0.0–149.0)
VLDL: 21.2 mg/dL (ref 0.0–40.0)

## 2022-10-12 LAB — PSA: PSA: 0.87 ng/mL (ref 0.10–4.00)

## 2022-10-12 LAB — CBC
HCT: 46.9 % (ref 39.0–52.0)
Hemoglobin: 15.8 g/dL (ref 13.0–17.0)
MCHC: 33.6 g/dL (ref 30.0–36.0)
MCV: 96.2 fl (ref 78.0–100.0)
Platelets: 185 10*3/uL (ref 150.0–400.0)
RBC: 4.88 Mil/uL (ref 4.22–5.81)
RDW: 13.1 % (ref 11.5–15.5)
WBC: 6.6 10*3/uL (ref 4.0–10.5)

## 2022-10-12 MED ORDER — KETOCONAZOLE 2 % EX SHAM: 1.0000 | MEDICATED_SHAMPOO | CUTANEOUS | 3 refills | Status: AC

## 2022-10-12 MED ORDER — FINASTERIDE 5 MG PO TABS: 2.5000 mg | ORAL_TABLET | ORAL | 3 refills | Status: DC

## 2022-10-12 NOTE — Assessment & Plan Note (Signed)
Symptoms controlled with the finasteride for his hair

## 2022-10-12 NOTE — Progress Notes (Signed)
Subjective:    Patient ID: Andrew Porter, male    DOB: 1960/08/08, 62 y.o.   MRN: [id number]  HPI Here for surgical clearance for a minor procedure and physical  Has plans for liposuction for extra tissue under chin Report is that sedation is all that is needed  Still playing tennis---may be quitting due to easier DOE (even with doubles) Has gone through cardiology evaluations--but nothing comes up (heart is fine) He relates it to side effects from the levaquin Low energy but able to walk---may need to be careful running up stairs  No chest pain No palpitations  Current Outpatient Medications on File Prior to Visit  Medication Sig Dispense Refill   aspirin 325 MG tablet Take 650 mg by mouth daily.     Cholecalciferol (DIALYVITE VITAMIN D 5000 PO) Take 5,000 Units by mouth daily.     finasteride (PROSCAR) 5 MG tablet Take 0.5 tablets (2.5 mg total) by mouth every other day. 45 tablet 3   Magnesium 500 MG TABS Take 500 mg by mouth daily.     Multiple Vitamin (MULTIVITAMIN) capsule Take 1 capsule by mouth daily.      VITAMIN K PO Take 200 mg by mouth daily.     loratadine (CLARITIN) 10 MG tablet Take 10 mg by mouth daily. (Patient not taking: Reported on 10/12/2022)     No current facility-administered medications on file prior to visit.    Allergies  Allergen Reactions   Levofloxacin Other (See Comments)    Nightmares,muscle aches    Quinolones     Nightmare and muscle aches, pt prefers not to use this class of antibiotics     Past Medical History:  Diagnosis Date   B12 deficiency    BPH (benign prostatic hyperplasia)    CAD (coronary artery disease)    a.) FFRCT 02/10/2021 --> 30-50% pLAD, 30% dLM, 30% pD1, 30% mRCA. FFR VALUES --> 0.96 pLAD, 0.91 mLAD, 0.84 dLAD; 0.87 dLCx; 9.94 mRCA, 0.93 dRCA.   CHF (congestive heart failure) ([place])    a.) TTE 10/01/2018 --> mild LV systolic dysfunction with mild LVH; LVEF 50%. LV GLS -15.2%.   Complication of anesthesia     a.) delayed emergence. b.) intraoperative bradycardia   Exertional chest pain    Large cisterna magna ([place]) 07/2008   a.) Tx'd with suboccipital craniotomy for fenestration and drainage; performed in [place].   Lyme disease 08/02/2003   Migraine with aura    Mitral stenosis    a.) s/p  MVR (32 mm ring with artificial cords) via RIGHT thoracotomy approach on 11/21/2006   MVP (mitral valve prolapse)    Pneumonia    Pre-diabetes    Sigmoid diverticulosis    Sleep disturbance    Valvular regurgitation    a.) TTE 10/01/2018 --> LVEF 50%; trivial MR, TR; mild PR.   Vitamin D deficiency     Past Surgical History:  Procedure Laterality Date   CRANIOTOMY FOR CYST FENESTRATION N/A 07/08/2008   Procedure: SUBOCCIPITAL CRANIOTOMY, C1 LAMINECTOMY, DURAPLSTY FOR ARACHNOID CYST FENESTRATION AND DRAINAGE OF LARGE CISTERNA MAGNA. Location: [facility]; Surgeon: Almyra Free, MD.   INGUINAL HERNIA REPAIR Right 2000   Procedure: RIGHT INGUINAL HERNIA REPAIR; Location: [place]; Surgeon: Hervey Ard, MD   MITRAL VALVE REPAIR N/A 11/21/2006   Procedure: MITRAL VALVE REPAIR (32 mm ring with artificial cords); Location: [facility]; Surgeon: Lajuana Matte, MD   THORACOTOMY Right 11/21/2006   Procedure: RIGHT THORACOTOMY FOR MITRAL VALVE REPAIR; Location: [facility]; Surgeon:  Lajuana Matte, MD    Family History  Problem Relation Age of Onset   Cancer Mother        "bone marrow"   Coronary artery disease Father    Coronary artery disease Paternal Grandfather    Diabetes Neg Hx    Hypertension Neg Hx     Social History   Socioeconomic History   Marital status: Married    Spouse name: Not on file   Number of children: 3   Years of education: Not on file   Highest education level: Not on file  Occupational History   Occupation: [employer]  Tobacco Use   Smoking status: Never    Passive exposure: Never   Smokeless tobacco: Never  Vaping Use   Vaping Use:  Never used  Substance and Sexual Activity   Alcohol use: Yes    Comment: rarely   Drug use: No   Sexual activity: Not on file  Other Topics Concern   Not on file  Social History Narrative   Not on file   Social Determinants of Health   Financial Resource Strain: Not on file  Food Insecurity: Not on file  Transportation Needs: Not on file  Physical Activity: Not on file  Stress: Not on file  Social Connections: Not on file  Intimate Partner Violence: Not on file   Review of Systems Sleeps okay Appetite is fine--weight stable Bowels okay Voids fine---good flow Chronic low level joint and back pains----also the same tingling Ongoing peri-nasal rash (and scalp----ketoconazole helps that) Wears seat belt Vision and hearing are okay     Objective:   Physical Exam Constitutional:      Appearance: Normal appearance.  HENT:     Mouth/Throat:     Pharynx: No oropharyngeal exudate or posterior oropharyngeal erythema.  Eyes:     Conjunctiva/sclera: Conjunctivae normal.     Pupils: Pupils are equal, round, and reactive to light.  Cardiovascular:     Rate and Rhythm: Normal rate and regular rhythm.     Pulses: Normal pulses.     Heart sounds: No murmur heard.    No gallop.  Pulmonary:     Effort: Pulmonary effort is normal.     Breath sounds: Normal breath sounds. No wheezing or rales.  Abdominal:     Palpations: Abdomen is soft.     Tenderness: There is no abdominal tenderness.  Musculoskeletal:     Cervical back: Neck supple.     Right lower leg: No edema.     Left lower leg: No edema.  Lymphadenopathy:     Cervical: No cervical adenopathy.  Skin:    Findings: No lesion.  Neurological:     General: No focal deficit present.     Mental Status: He is alert and oriented to person, place, and time.  Psychiatric:        Mood and Affect: Mood normal.        Behavior: Behavior normal.            Assessment & Plan:

## 2022-10-12 NOTE — Assessment & Plan Note (Signed)
Will renew the ketoconazole shampoo

## 2022-10-12 NOTE — Assessment & Plan Note (Signed)
Healthy but having ongoing issues with stamina/sensory changes Approved for the minor surgery--no contraindications Will check PSA Colon due 2031 Prefers no COVID boosters Consider flu vaccine in the fall

## 2023-08-06 NOTE — Progress Notes (Addendum)
 Andrew Stefanick T. Andrew Belleau, MD, CAQ Sports Medicine [facility] [address] [place], [address]  Phone: [phone]  FAX: [phone]  Andrew Porter - 63 y.o. male  MRN [id number]  Date of Birth: [birth year]  Date: 08/07/2023  PCP: Jimmy Charlie FERNS, MD  Referral: Jimmy Charlie FERNS, MD  Chief Complaint  Patient presents with  . Leg Cramps    Muscles just give out.  Been in bed since last Tuesday  . Arm Pain    From Fatigue  . Tingling    All Over   Subjective:   Andrew Porter is a 63 y.o. very pleasant male patient with Body mass index is 27.91 kg/m. who presents with the following:  Patient presents with ongoing acute leg cramping, global fatigue as well as diffuse tingling throughout the entirety of his body.  The patient has been in bed and minimally able to active Lee movement and function since last Tuesday..  Really bad about last tuesday Has been building up for a while  If holding cell phone, bicep will get tired in two three minutes. Strength is normal, and trying to open a pickle jar, muscles will lock up, and he is very easily fatigable.  Legs, calves, thighs, tingling all over. Severe headaches.   Feeling of weakness. Cannot go anymore and like  nauseated feeling.   Can lie in the bed for a while and maybe better, but up for very long, and he will get out  Plays tennis three times a weak, more and more fatigued every time he tries to play. Has had some pain under his ribs, but not all the time.  Gets out of breath with exercise and playing tennis.   2010 - LVQ and felt really bad and abnormal for 3 years.  At that time, diffuse weakness, and he globally felt extremely poorly in all areas for several years.  Cardiac history Mitral valve repair distantly - 2008 FFR nomal 30-50 % calcification on Coronary CT  Tingling since was on LVQ 15 years ago.  Much worse in the last week, too.  Review of  Systems is noted in the HPI, as appropriate  Patient Active Problem List   Diagnosis Date Noted  . Coronary artery disease, non-occlusive 08/07/2023  . Seborrhea 05/20/2021  . BPH (benign prostatic hyperplasia) 01/03/2018  . Routine general medical examination at a health care facility 05/22/2012  . Migraine with aura   . Alopecia 10/08/2009    Past Medical History:  Diagnosis Date  . B12 deficiency   . BPH (benign prostatic hyperplasia)   . CAD (coronary artery disease)    a.) FFRCT 02/10/2021 --> 30-50% pLAD, 30% dLM, 30% pD1, 30% mRCA. FFR VALUES --> 0.96 pLAD, 0.91 mLAD, 0.84 dLAD; 0.87 dLCx; 9.94 mRCA, 0.93 dRCA.  SABRA CHF (congestive heart failure) ([facility])    a.) TTE 10/01/2018 --> mild LV systolic dysfunction with mild LVH; LVEF 50%. LV GLS -15.2%.  . Complication of anesthesia    a.) delayed emergence. b.) intraoperative bradycardia  . Exertional chest pain   . Large cisterna magna ([facility]) 07/2008   a.) Tx'd with suboccipital craniotomy for fenestration and drainage; performed in [place] .  . Lyme disease 08/02/2003  . Migraine with aura   . Mitral stenosis    a.) s/p  MVR (32 mm ring with artificial cords) via RIGHT thoracotomy approach on 11/21/2006  . MVP (mitral valve prolapse)   . Pneumonia   . Pre-diabetes   .  Sigmoid diverticulosis   . Sleep disturbance   . Valvular regurgitation    a.) TTE 10/01/2018 --> LVEF 50%; trivial MR, TR; mild PR.  . Vitamin D  deficiency     Past Surgical History:  Procedure Laterality Date  . CRANIOTOMY FOR CYST FENESTRATION N/A 07/08/2008   Procedure: SUBOCCIPITAL CRANIOTOMY, C1 LAMINECTOMY, DURAPLSTY FOR ARACHNOID CYST FENESTRATION AND DRAINAGE OF LARGE CISTERNA MAGNA. Location: [place]  [facility]; Surgeon: Aleene Chol, MD.  . INGUINAL HERNIA REPAIR Right 2000   Procedure: RIGHT INGUINAL HERNIA REPAIR; Location: [facility]; Surgeon: Reyes Cota, MD  . MITRAL VALVE REPAIR N/A 11/21/2006   Procedure: MITRAL VALVE REPAIR (32  mm ring with artificial cords); Location: [facility]; Surgeon: Nancyann Schwab, MD  . THORACOTOMY Right 11/21/2006   Procedure: RIGHT THORACOTOMY FOR MITRAL VALVE REPAIR; Location: [facility]; Surgeon: Nancyann Schwab, MD    Family History  Problem Relation Age of Onset  . Cancer Mother        bone marrow  . Coronary artery disease Father   . Coronary artery disease Paternal Grandfather   . Diabetes Neg Hx   . Hypertension Neg Hx     Social History   Social History Narrative  . Not on file     Objective:   BP 108/70 (BP Location: Left Arm, Patient Position: Sitting, Cuff Size: Large)   Pulse 75   Temp 98 F (36.7 C) (Temporal)   Ht 5' 10 (1.778 m)   Wt 194 lb 8 oz (88.2 kg)   SpO2 96%   BMI 27.91 kg/m    GEN: WDWN, NAD, Non-toxic HEENT: Atraumatic, Normocephalic. Neck supple. No masses. CV: RRR, No M/G/R. No JVD. No thrill. No extra heart sounds. PULM: CTA B, no wheezes, crackles, rhonchi. No retractions. No resp. distress. No accessory muscle use. EXTR: No c/c/e NEURO Normal gait.   Neuro: CN 2-12 grossly intact. PERRLA. EOMI. Sensation intact throughout. Str 5/5 all extremities. DTR 2+. No clonus. A and o x 4. Romberg neg. Finger nose neg. Heel -shin neg.   PSYCH: Normally interactive. Conversant. Not depressed or anxious appearing.  Calm demeanor.    Laboratory and Imaging Data: CLINICAL DATA:  Chest pain   EXAM: CT CHEST WITH CONTRAST   TECHNIQUE: Multidetector CT imaging of the chest was performed during intravenous contrast administration.   RADIATION DOSE REDUCTION: This exam was performed according to the departmental dose-optimization program which includes automated exposure control, adjustment of the mA and/or kV according to patient size and/or use of iterative reconstruction technique.   CONTRAST:  75mL OMNIPAQUE  IOHEXOL  300 MG/ML  SOLN   COMPARISON:  None Available.   FINDINGS: Cardiovascular: No significant vascular findings. Normal heart size. No  pericardial effusion. Coronary artery atherosclerosis.   Mediastinum/Nodes: No enlarged mediastinal, hilar, or axillary lymph nodes. Thyroid  gland, trachea, and esophagus demonstrate no significant findings.   Lungs/Pleura: Lungs are clear. No pleural effusion or pneumothorax.   Upper Abdomen: No acute abnormality.   Musculoskeletal: Old right-sided rib fractures.   IMPRESSION: 1. No acute findings in the chest. 2. Coronary artery atherosclerosis.     Electronically Signed   By: Franky Stanford M.D.   On: 05/03/2022 01:54    Assessment and Plan:     ICD-10-CM   1. Weakness  R53.1 CBC with Differential/Platelet    Basic metabolic panel    Hepatic function panel    Hemoglobin A1c    Vitamin B12    VITAMIN D  25 Hydroxy (Vit-D Deficiency, Fractures)    HIV Antibody (routine  testing w rflx)    RPR    B. burgdorfi antibodies by WB    IBC + Ferritin    TSH    Heavy Metals Panel, Blood    Ambulatory referral to Neurology    2. Chronic fatigue  R53.82 CBC with Differential/Platelet    Basic metabolic panel    Hepatic function panel    Hemoglobin A1c    Vitamin B12    VITAMIN D  25 Hydroxy (Vit-D Deficiency, Fractures)    HIV Antibody (routine testing w rflx)    RPR    B. burgdorfi antibodies by WB    IBC + Ferritin    TSH    Heavy Metals Panel, Blood    Ambulatory referral to Neurology    3. Tingling  R20.2 CBC with Differential/Platelet    Basic metabolic panel    Hepatic function panel    Hemoglobin A1c    Vitamin B12    VITAMIN D  25 Hydroxy (Vit-D Deficiency, Fractures)    HIV Antibody (routine testing w rflx)    RPR    B. burgdorfi antibodies by WB    IBC + Ferritin    TSH    Heavy Metals Panel, Blood    Ambulatory referral to Neurology    4. Acute non intractable tension-type headache  G44.209 Ambulatory referral to Neurology    5. Nausea  R11.0     6. Dyspnea on exertion  R06.09     7. Coronary artery disease, non-occlusive  I25.10      Acute on  chronic global fatigue, weakness, easy fatigability of musculature with acute onset diffuse and global tingling throughout the entire body.  He is also weak and getting short of breath over the last 6 months to more than this with exertion.  He has had a favorable cardiac workup with FFR showing no impairment with underlying coronary disease.  CT of the chest from just more than a year ago was also reassuring without any concerning findings.  Multiple etiologies are possible.  Assess basic lab work and additionally assessed thyroid  function, iron levels, Lyme disease, HIV, assess for neurosyphilis, B12 deficiency, vitamin D  deficiency.  If the entirety of this workup is normal, my recommendation to the patient was to have an MRI of the brain with and without contrast to evaluate for demyelinating disease or potential neoplasm.  He would like to await all laboratories prior to ordering.  Reasonable.  Addendum: 08/15/23 10:02 AM  At this point all of the patient's labs have returned as normal.  Acute diffuse tingling, weakness, easily fatigable musculature, chronic fatigue.  Given constellation of symptoms, think further neurological evaluation would be an appropriate neck step.  We will consult neurology.  Results for orders placed or performed in visit on 08/07/23  CBC with Differential/Platelet   Collection Time: 08/07/23 12:51 PM  Result Value Ref Range   WBC 6.5 4.0 - 10.5 K/uL   RBC 4.99 4.22 - 5.81 Mil/uL   Hemoglobin 16.3 13.0 - 17.0 g/dL   HCT 50.8 60.9 - 47.9 %   MCV 98.3 78.0 - 100.0 fl   MCHC 33.2 30.0 - 36.0 g/dL   RDW 87.0 88.4 - 84.4 %   Platelets 187.0 150.0 - 400.0 K/uL   Neutrophils Relative % 60.1 43.0 - 77.0 %   Lymphocytes Relative 28.8 12.0 - 46.0 %   Monocytes Relative 9.8 3.0 - 12.0 %   Eosinophils Relative 0.9 0.0 - 5.0 %   Basophils Relative 0.4 0.0 - 3.0 %  Neutro Abs 3.9 1.4 - 7.7 K/uL   Lymphs Abs 1.9 0.7 - 4.0 K/uL   Monocytes Absolute 0.6 0.1 - 1.0 K/uL    Eosinophils Absolute 0.1 0.0 - 0.7 K/uL   Basophils Absolute 0.0 0.0 - 0.1 K/uL  Basic metabolic panel   Collection Time: 09/03/2023 12:51 PM  Result Value Ref Range   Sodium 138 135 - 145 mEq/L   Potassium 4.0 3.5 - 5.1 mEq/L   Chloride 100 96 - 112 mEq/L   CO2 30 19 - 32 mEq/L   Glucose, Bld 91 70 - 99 mg/dL   BUN 18 6 - 23 mg/dL   Creatinine, Ser 8.80 0.40 - 1.50 mg/dL   GFR 34.09 >39.99 mL/min   Calcium 9.5 8.4 - 10.5 mg/dL  Hepatic function panel   Collection Time: 09-03-2023 12:51 PM  Result Value Ref Range   Total Bilirubin 0.6 0.2 - 1.2 mg/dL   Bilirubin, Direct 0.1 0.0 - 0.3 mg/dL   Alkaline Phosphatase 79 39 - 117 U/L   AST 19 0 - 37 U/L   ALT 19 0 - 53 U/L   Total Protein 7.4 6.0 - 8.3 g/dL   Albumin 4.4 3.5 - 5.2 g/dL  Hemoglobin J8r   Collection Time: 09-03-23 12:51 PM  Result Value Ref Range   Hgb A1c MFr Bld 5.9 4.6 - 6.5 %  Vitamin B12   Collection Time: September 03, 2023 12:51 PM  Result Value Ref Range   Vitamin B-12 1,065 (H) 211 - 911 pg/mL  VITAMIN D  25 Hydroxy (Vit-D Deficiency, Fractures)   Collection Time: 09-03-23 12:51 PM  Result Value Ref Range   VITD 38.13 30.00 - 100.00 ng/mL  HIV Antibody (routine testing w rflx)   Collection Time: 09-03-2023 12:51 PM  Result Value Ref Range   HIV 1&2 Ab, 4th Generation NON-REACTIVE NON-REACTIVE  RPR   Collection Time: 09-03-23 12:51 PM  Result Value Ref Range   RPR Ser Ql NON-REACTIVE NON-REACTIVE  B. burgdorfi antibodies by WB   Collection Time: 03-Sep-2023 12:51 PM  Result Value Ref Range   B burgdorferi IgG Abs (IB) NEGATIVE NEGATIVE   Lyme Disease 18 kD IgG NON-REACTIVE    Lyme Disease 23 kD IgG NON-REACTIVE    Lyme Disease 28 kD IgG NON-REACTIVE    Lyme Disease 30 kD IgG NON-REACTIVE    Lyme Disease 39 kD IgG NON-REACTIVE    Lyme Disease 41 kD IgG REACTIVE (A)    Lyme Disease 45 kD IgG REACTIVE (A)    Lyme Disease 58 kD IgG NON-REACTIVE    Lyme Disease 66 kD IgG NON-REACTIVE    Lyme Disease 93 kD IgG  NON-REACTIVE    B burgdorferi IgM Abs (IB) NEGATIVE NEGATIVE   Lyme Disease 23 kD IgM NON-REACTIVE    Lyme Disease 39 kD IgM NON-REACTIVE    Lyme Disease 41 kD IgM NON-REACTIVE   IBC + Ferritin   Collection Time: Sep 03, 2023 12:51 PM  Result Value Ref Range   Iron 124 42 - 165 ug/dL   Transferrin 736.9 787.9 - 360.0 mg/dL   Saturation Ratios 66.2 20.0 - 50.0 %   Ferritin 65.4 22.0 - 322.0 ng/mL   TIBC 368.2 250.0 - 450.0 mcg/dL  TSH   Collection Time: 03-Sep-2023 12:51 PM  Result Value Ref Range   TSH 2.30 0.35 - 5.50 uIU/mL  Heavy Metals Panel, Blood   Collection Time: 09-03-23 12:51 PM  Result Value Ref Range   Arsenic <3 <23 mcg/L   Lead <1.0 <3.5 mcg/dL  [place], B <5 <11 mcg/L     Medication Management during today's office visit: No orders of the defined types were placed in this encounter.  Medications Discontinued During This Encounter  Medication Reason  . loratadine (CLARITIN) 10 MG tablet Completed Course  . aspirin 325 MG tablet Completed Course    Orders placed today for conditions managed today: Orders Placed This Encounter  Procedures  . CBC with Differential/Platelet  . Basic metabolic panel  . Hepatic function panel  . Hemoglobin A1c  . Vitamin B12  . VITAMIN D  25 Hydroxy (Vit-D Deficiency, Fractures)  . HIV Antibody (routine testing w rflx)  . RPR  . B. burgdorfi antibodies by WB  . IBC + Ferritin  . TSH  . Heavy Metals Panel, Blood  . Ambulatory referral to Neurology    Disposition: Depends on additional workup  [employer] One speech-to-text software was used for transcription in this dictation.  Possible transcriptional errors can occur using Animal nutritionist.   Signed,  Jacques DASEN. Cirilo Canner, MD   Outpatient Encounter Medications as of 08/07/2023  Medication Sig  . Cholecalciferol (DIALYVITE VITAMIN D  5000 PO) Take 5,000 Units by mouth daily.  . finasteride  (PROSCAR ) 5 MG tablet Take 0.5 tablets (2.5 mg total) by mouth every other day.  .  ketoconazole  (NIZORAL ) 2 % shampoo Apply 1 Application topically 2 (two) times a week.  . Magnesium 500 MG TABS Take 500 mg by mouth daily.  . Multiple Vitamin (MULTIVITAMIN) capsule Take 1 capsule by mouth daily.   SABRA VITAMIN K PO Take 200 mg by mouth daily.  . [DISCONTINUED] aspirin 325 MG tablet Take 650 mg by mouth daily.  . [DISCONTINUED] loratadine (CLARITIN) 10 MG tablet Take 10 mg by mouth daily. (Patient not taking: Reported on 10/12/2022)   No facility-administered encounter medications on file as of 08/07/2023.

## 2023-08-07 ENCOUNTER — Encounter: Payer: Self-pay | Admitting: Family Medicine

## 2023-08-07 ENCOUNTER — Ambulatory Visit (INDEPENDENT_AMBULATORY_CARE_PROVIDER_SITE_OTHER): Payer: 59 | Admitting: Family Medicine

## 2023-08-07 VITALS — BP 108/70 | HR 75 | Temp 98.0°F | Ht 70.0 in | Wt 194.5 lb

## 2023-08-07 DIAGNOSIS — R110 Nausea: Secondary | ICD-10-CM

## 2023-08-07 DIAGNOSIS — R5382 Chronic fatigue, unspecified: Secondary | ICD-10-CM

## 2023-08-07 DIAGNOSIS — R531 Weakness: Secondary | ICD-10-CM | POA: Diagnosis not present

## 2023-08-07 DIAGNOSIS — R0609 Other forms of dyspnea: Secondary | ICD-10-CM

## 2023-08-07 DIAGNOSIS — I2510 Atherosclerotic heart disease of native coronary artery without angina pectoris: Secondary | ICD-10-CM

## 2023-08-07 DIAGNOSIS — R202 Paresthesia of skin: Secondary | ICD-10-CM | POA: Diagnosis not present

## 2023-08-07 DIAGNOSIS — G44209 Tension-type headache, unspecified, not intractable: Secondary | ICD-10-CM

## 2023-08-07 LAB — HEPATIC FUNCTION PANEL
ALT: 19 U/L (ref 0–53)
AST: 19 U/L (ref 0–37)
Albumin: 4.4 g/dL (ref 3.5–5.2)
Alkaline Phosphatase: 79 U/L (ref 39–117)
Bilirubin, Direct: 0.1 mg/dL (ref 0.0–0.3)
Total Bilirubin: 0.6 mg/dL (ref 0.2–1.2)
Total Protein: 7.4 g/dL (ref 6.0–8.3)

## 2023-08-07 LAB — VITAMIN D 25 HYDROXY (VIT D DEFICIENCY, FRACTURES): VITD: 38.13 ng/mL (ref 30.00–100.00)

## 2023-08-07 LAB — CBC WITH DIFFERENTIAL/PLATELET
Basophils Absolute: 0 10*3/uL (ref 0.0–0.1)
Basophils Relative: 0.4 % (ref 0.0–3.0)
Eosinophils Absolute: 0.1 10*3/uL (ref 0.0–0.7)
Eosinophils Relative: 0.9 % (ref 0.0–5.0)
HCT: 49.1 % (ref 39.0–52.0)
Hemoglobin: 16.3 g/dL (ref 13.0–17.0)
Lymphocytes Relative: 28.8 % (ref 12.0–46.0)
Lymphs Abs: 1.9 10*3/uL (ref 0.7–4.0)
MCHC: 33.2 g/dL (ref 30.0–36.0)
MCV: 98.3 fL (ref 78.0–100.0)
Monocytes Absolute: 0.6 10*3/uL (ref 0.1–1.0)
Monocytes Relative: 9.8 % (ref 3.0–12.0)
Neutro Abs: 3.9 10*3/uL (ref 1.4–7.7)
Neutrophils Relative %: 60.1 % (ref 43.0–77.0)
Platelets: 187 10*3/uL (ref 150.0–400.0)
RBC: 4.99 Mil/uL (ref 4.22–5.81)
RDW: 12.9 % (ref 11.5–15.5)
WBC: 6.5 10*3/uL (ref 4.0–10.5)

## 2023-08-07 LAB — HEMOGLOBIN A1C: Hgb A1c MFr Bld: 5.9 % (ref 4.6–6.5)

## 2023-08-07 LAB — TSH: TSH: 2.3 u[IU]/mL (ref 0.35–5.50)

## 2023-08-07 LAB — IBC + FERRITIN
Ferritin: 65.4 ng/mL (ref 22.0–322.0)
Iron: 124 ug/dL (ref 42–165)
Saturation Ratios: 33.7 % (ref 20.0–50.0)
TIBC: 368.2 ug/dL (ref 250.0–450.0)
Transferrin: 263 mg/dL (ref 212.0–360.0)

## 2023-08-07 LAB — BASIC METABOLIC PANEL
BUN: 18 mg/dL (ref 6–23)
CO2: 30 meq/L (ref 19–32)
Calcium: 9.5 mg/dL (ref 8.4–10.5)
Chloride: 100 meq/L (ref 96–112)
Creatinine, Ser: 1.19 mg/dL (ref 0.40–1.50)
GFR: 65.9 mL/min (ref >60.00)
Glucose, Bld: 91 mg/dL (ref 70–99)
Potassium: 4 meq/L (ref 3.5–5.1)
Sodium: 138 meq/L (ref 135–145)

## 2023-08-07 LAB — VITAMIN B12: Vitamin B-12: 1065 pg/mL — ABNORMAL HIGH (ref 211–911)

## 2023-08-12 LAB — B. BURGDORFI ANTIBODIES BY WB
B burgdorferi IgG Abs (IB): NEGATIVE
B burgdorferi IgM Abs (IB): NEGATIVE
Lyme Disease 18 kD IgG: NONREACTIVE
Lyme Disease 23 kD IgG: NONREACTIVE
Lyme Disease 23 kD IgM: NONREACTIVE
Lyme Disease 28 kD IgG: NONREACTIVE
Lyme Disease 30 kD IgG: NONREACTIVE
Lyme Disease 39 kD IgG: NONREACTIVE
Lyme Disease 39 kD IgM: NONREACTIVE
Lyme Disease 41 kD IgG: REACTIVE — AB
Lyme Disease 41 kD IgM: NONREACTIVE
Lyme Disease 45 kD IgG: REACTIVE — AB
Lyme Disease 58 kD IgG: NONREACTIVE
Lyme Disease 66 kD IgG: NONREACTIVE
Lyme Disease 93 kD IgG: NONREACTIVE

## 2023-08-12 LAB — RPR: RPR Ser Ql: NONREACTIVE

## 2023-08-12 LAB — HEAVY METALS PANEL, BLOOD
Arsenic: <3 ug/L (ref <23)
Lead: <1 ug/dL (ref <3.5)
Mercury, B: <5 ug/L (ref <11)

## 2023-08-12 LAB — HIV ANTIBODY (ROUTINE TESTING W REFLEX): HIV 1&2 Ab, 4th Generation: NONREACTIVE

## 2023-08-14 ENCOUNTER — Encounter: Payer: Self-pay | Admitting: Family Medicine

## 2023-08-15 NOTE — Addendum Note (Signed)
 Addended by: Hannah Beat on: 08/15/2023 10:02 AM   Modules accepted: Orders

## 2023-10-12 ENCOUNTER — Encounter: Payer: 59 | Admitting: Internal Medicine

## 2023-11-02 ENCOUNTER — Encounter: Payer: 59 | Admitting: Internal Medicine

## 2023-11-06 ENCOUNTER — Telehealth: Payer: Self-pay

## 2023-11-06 NOTE — Telephone Encounter (Signed)
 Copied from CRM [id number]. Topic: Clinical - Medication Question >> Nov 06, 2023 11:05 AM Arley Phenix D wrote: Reason for CRM: Patient stated that he is traveling out of town in a few weeks and [place]. Patient wants to know if he can get approved for an oxygen concentrator medication and diamox.

## 2023-11-06 NOTE — Telephone Encounter (Signed)
 He needs an office visit. He was a no show for his physical last week.

## 2023-11-06 NOTE — Telephone Encounter (Signed)
 [email or web address]

## 2024-04-01 ENCOUNTER — Other Ambulatory Visit: Payer: Self-pay | Admitting: Internal Medicine

## 2024-04-02 NOTE — Telephone Encounter (Signed)
Pt needs to establish with a new provider.

## 2024-04-03 NOTE — Telephone Encounter (Signed)
 I called patient and left a voicemail to be schedule.

## 2024-06-13 ENCOUNTER — Ambulatory Visit

## 2024-06-13 VITALS — BP 138/88 | HR 66 | Temp 97.7°F | Ht 70.0 in | Wt 187.0 lb

## 2024-06-13 DIAGNOSIS — R7303 Prediabetes: Secondary | ICD-10-CM | POA: Diagnosis not present

## 2024-06-13 DIAGNOSIS — Z125 Encounter for screening for malignant neoplasm of prostate: Secondary | ICD-10-CM | POA: Diagnosis not present

## 2024-06-13 DIAGNOSIS — R21 Rash and other nonspecific skin eruption: Secondary | ICD-10-CM | POA: Diagnosis not present

## 2024-06-13 DIAGNOSIS — E785 Hyperlipidemia, unspecified: Secondary | ICD-10-CM

## 2024-06-13 DIAGNOSIS — T36AX5A Adverse effect of fluoroquinolone antibiotics, initial encounter: Secondary | ICD-10-CM | POA: Insufficient documentation

## 2024-06-13 DIAGNOSIS — E559 Vitamin D deficiency, unspecified: Secondary | ICD-10-CM

## 2024-06-13 DIAGNOSIS — Z113 Encounter for screening for infections with a predominantly sexual mode of transmission: Secondary | ICD-10-CM

## 2024-06-13 MED ORDER — TRIAMCINOLONE ACETONIDE 0.1 % EX CREA: 1.0000 | TOPICAL_CREAM | Freq: Two times a day (BID) | CUTANEOUS | 0 refills | Status: AC

## 2024-06-13 NOTE — Patient Instructions (Signed)
 Thank you for visiting [facility] today! Here's what we talked about: - START Kenalog  cream twice daily. Watch for side effects we discussed.

## 2024-06-13 NOTE — Progress Notes (Signed)
 Subjective:   This visit was conducted in person. The patient gave informed consent to the use of [employer]bridge AI technology to record the contents of the encounter as documented below.   Patient ID: Andrew Porter, male    DOB: 28-May-1961, 63 y.o.   MRN: [id number]   Discussed the use of AI scribe software for clinical note transcription with the patient, who gave verbal consent to proceed.  History of Present Illness Andrew Porter is a 63 year old male who presents with a chest rash.  He has a long-standing rash on his chest, present for years, which does not itch or spread. He also experiences a rash on his face, which he manages with over-the-counter dandruff shampoo and ketoconazole  shampoo when necessary.  He has a history of mitral valve issues for which he underwent surgery in 2008, involving the placement of artificial cords. He has a history of migraines which have significantly subsided over time.  He is currently taking finasteride  for prostate enlargement, at a dose of half a tablet every other day, due to sexual side effects when taken daily.  He was informed of prediabetes following an A1c test earlier this year, which showed a level of 5.9. He has made dietary changes, adopting a 'meat-focused diet' to manage his weight and muscle mass, and is not a consumer of sweets or sodas.  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 He has been experimenting with dietary changes and supplements, including L-carnitine, to manage symptoms.  He takes several supplements including magnesium, a multivitamin, vitamin K, and has recently started taking copper and CoQ10. He has stopped taking high-dose vitamin D  supplements.    Review of Systems  All other systems reviewed and are negative.       Allergies  Allergen Reactions   Levofloxacin Other (See Comments)    Severe muscle damage    Quinolones Other (See  Comments)    Has toxicity from this class. Severe muscle pain and damage.    Current Outpatient Medications on File Prior to Visit  Medication Sig Dispense Refill   finasteride  (PROSCAR ) 5 MG tablet Take 1/2 tablet by mouth every other day. 45 tablet 0   ketoconazole  (NIZORAL ) 2 % shampoo Apply 1 Application topically 2 (two) times a week. 360 mL 3   Magnesium 500 MG TABS Take 500 mg by mouth daily.     Multiple Vitamin (MULTIVITAMIN) capsule Take 1 capsule by mouth daily.      VITAMIN K PO Take 200 mg by mouth daily.     No current facility-administered medications on file prior to visit.    BP 138/88 (BP Location: Left Arm, Patient Position: Sitting, Cuff Size: Large)   Pulse 66   Temp 97.7 F (36.5 C) (Oral)   Ht 5' 10 (1.778 m)   Wt 187 lb (84.8 kg)   SpO2 97%   BMI 26.83 kg/m   Objective:      Physical Exam GENERAL: Alert, cooperative, well developed, no acute distress. HEAD: Normocephalic atraumatic. EYES: Extraocular movements intact bilaterally, pupils round, equal and reactive to light bilaterally, conjunctivae normal bilaterally. EXTREMITIES: No cyanosis or edema. NEUROLOGICAL: Oriented to person, place and time, no gait abnormalities, moves all extremities without gross motor or sensory deficit. SKIN: Erythematous, flaky rash present on the central chest. Dry flaky plaques around the eyebrows bilaterally.         Assessment & Plan:   Assessment &  Plan Rash of central chest Erythematous, flaky rash, possibly eczema vs psoriasis. Discussed steroid cream use and side effects.  - Prescribed steroid cream for 14 days. - Advised use only if rash flares post-14 days.  Prediabetes A1c 5.9% earlier this year indicates dysregulated blood sugar. Discussed dietary changes to prevent diabetes. - Ordered fasting A1c. - Advised reducing processed foods, increasing whole foods.  General Health maintenance Vit D Deficiency - Ordered fasting labs: A1c,  cholesterol. - Ordered PSA test. - Ordered hepatitis C test. - Ordered vitamin D  level. - Scheduled follow-up in three weeks for physical exam.  Hyperlipidemia Elevated cholesterol a year ago.   - Ordered fasting cholesterol test. - Will calculate cardiovascular risk score post-labs.  Benign prostatic hyperplasia Managed with finasteride . Discussed PSA testing implications. - Ordered PSA test. - Continue finasteride  at current dose.     Return in about 3 weeks (around 07/04/2024) for CPE, fasting labs tomorrow.   Vaniyah Lansky K Kimoni Pickerill, MD  06/13/24     Contains text generated by [employer].

## 2024-06-14 ENCOUNTER — Other Ambulatory Visit (INDEPENDENT_AMBULATORY_CARE_PROVIDER_SITE_OTHER)

## 2024-06-14 DIAGNOSIS — E785 Hyperlipidemia, unspecified: Secondary | ICD-10-CM | POA: Diagnosis not present

## 2024-06-14 DIAGNOSIS — Z125 Encounter for screening for malignant neoplasm of prostate: Secondary | ICD-10-CM

## 2024-06-14 DIAGNOSIS — R7303 Prediabetes: Secondary | ICD-10-CM | POA: Diagnosis not present

## 2024-06-14 DIAGNOSIS — E559 Vitamin D deficiency, unspecified: Secondary | ICD-10-CM

## 2024-06-14 DIAGNOSIS — Z113 Encounter for screening for infections with a predominantly sexual mode of transmission: Secondary | ICD-10-CM

## 2024-06-14 LAB — VITAMIN D 25 HYDROXY (VIT D DEFICIENCY, FRACTURES): VITD: 31.82 ng/mL (ref 30.00–100.00)

## 2024-06-14 LAB — PSA: PSA: 0.41 ng/mL (ref 0.10–4.00)

## 2024-06-14 LAB — LIPID PANEL
Cholesterol: 218 mg/dL — ABNORMAL HIGH (ref 0–200)
HDL: 60.7 mg/dL (ref >39.00)
LDL Cholesterol: 145 mg/dL — ABNORMAL HIGH (ref 0–99)
NonHDL: 157.79
Total CHOL/HDL Ratio: 4
Triglycerides: 62 mg/dL (ref 0.0–149.0)
VLDL: 12.4 mg/dL (ref 0.0–40.0)

## 2024-06-15 LAB — HEPATITIS C ANTIBODY: Hepatitis C Ab: NONREACTIVE

## 2024-06-17 ENCOUNTER — Ambulatory Visit: Payer: Self-pay

## 2024-06-17 LAB — HEMOGLOBIN A1C: Hgb A1c MFr Bld: 5.6 % (ref 4.6–6.5)

## 2024-06-17 MED ORDER — VITAMIN D3 1000 UNITS PO CAPS: 1.0000 | ORAL_CAPSULE | Freq: Every day | ORAL | 0 refills | Status: AC

## 2024-07-17 NOTE — Addendum Note (Signed)
 Addended by: Jordynn Marcella K on: 07/17/2024 10:27 PM   Modules accepted: Orders

## 2024-07-18 NOTE — Telephone Encounter (Signed)
 lvm for pt to call office to schedule appt.

## 2024-07-19 ENCOUNTER — Other Ambulatory Visit (INDEPENDENT_AMBULATORY_CARE_PROVIDER_SITE_OTHER)

## 2024-07-19 DIAGNOSIS — E785 Hyperlipidemia, unspecified: Secondary | ICD-10-CM

## 2024-07-19 DIAGNOSIS — I2510 Atherosclerotic heart disease of native coronary artery without angina pectoris: Secondary | ICD-10-CM | POA: Diagnosis not present

## 2024-07-25 LAB — LIPOPROTEIN A (LPA): Lipoprotein (a): 67 nmol/L

## 2024-07-29 ENCOUNTER — Ambulatory Visit: Payer: Self-pay

## 2024-08-13 ENCOUNTER — Encounter

## 2024-09-05 ENCOUNTER — Other Ambulatory Visit: Payer: Self-pay | Admitting: Family
